# Patient Record
Sex: Female | Born: 1970 | Race: White | Hispanic: No | Marital: Married | Smoking: Never smoker
Health system: Southern US, Community
[De-identification: ages and names within clinical notes are randomized; demographics above are authoritative.]

## PROBLEM LIST (undated history)

## (undated) DIAGNOSIS — K219 Gastro-esophageal reflux disease without esophagitis: Secondary | ICD-10-CM

## (undated) DIAGNOSIS — T7840XA Allergy, unspecified, initial encounter: Secondary | ICD-10-CM

## (undated) HISTORY — PX: UPPER GASTROINTESTINAL ENDOSCOPY: SHX188

## (undated) HISTORY — DX: Allergy, unspecified, initial encounter: T78.40XA

## (undated) HISTORY — PX: WISDOM TOOTH EXTRACTION: SHX21

## (undated) HISTORY — DX: Gastro-esophageal reflux disease without esophagitis: K21.9

---

## 2005-11-16 HISTORY — PX: SIGMOIDOSCOPY: SUR1295

## 2016-01-09 ENCOUNTER — Encounter: Payer: Self-pay | Admitting: Family Medicine

## 2017-09-08 DIAGNOSIS — Z23 Encounter for immunization: Secondary | ICD-10-CM | POA: Diagnosis not present

## 2017-11-03 DIAGNOSIS — R87610 Atypical squamous cells of undetermined significance on cytologic smear of cervix (ASC-US): Secondary | ICD-10-CM | POA: Diagnosis not present

## 2017-11-03 DIAGNOSIS — Z01419 Encounter for gynecological examination (general) (routine) without abnormal findings: Secondary | ICD-10-CM | POA: Diagnosis not present

## 2017-11-03 DIAGNOSIS — Z124 Encounter for screening for malignant neoplasm of cervix: Secondary | ICD-10-CM | POA: Diagnosis not present

## 2017-11-03 DIAGNOSIS — Z6829 Body mass index (BMI) 29.0-29.9, adult: Secondary | ICD-10-CM | POA: Diagnosis not present

## 2017-11-03 DIAGNOSIS — N6310 Unspecified lump in the right breast, unspecified quadrant: Secondary | ICD-10-CM | POA: Diagnosis not present

## 2017-11-03 DIAGNOSIS — Z3040 Encounter for surveillance of contraceptives, unspecified: Secondary | ICD-10-CM | POA: Diagnosis not present

## 2017-12-07 DIAGNOSIS — N644 Mastodynia: Secondary | ICD-10-CM | POA: Diagnosis not present

## 2018-03-17 ENCOUNTER — Encounter: Payer: Self-pay | Admitting: Family Medicine

## 2018-03-17 ENCOUNTER — Other Ambulatory Visit: Payer: Self-pay

## 2018-03-17 ENCOUNTER — Ambulatory Visit: Payer: BLUE CROSS/BLUE SHIELD | Admitting: Family Medicine

## 2018-03-17 VITALS — BP 128/80 | HR 100 | Temp 98.6°F | Ht 62.5 in | Wt 169.2 lb

## 2018-03-17 DIAGNOSIS — Z7189 Other specified counseling: Secondary | ICD-10-CM | POA: Diagnosis not present

## 2018-03-17 DIAGNOSIS — Z119 Encounter for screening for infectious and parasitic diseases, unspecified: Secondary | ICD-10-CM

## 2018-03-17 DIAGNOSIS — Z111 Encounter for screening for respiratory tuberculosis: Secondary | ICD-10-CM | POA: Diagnosis not present

## 2018-03-17 DIAGNOSIS — G4719 Other hypersomnia: Secondary | ICD-10-CM

## 2018-03-17 DIAGNOSIS — Z7184 Encounter for health counseling related to travel: Secondary | ICD-10-CM

## 2018-03-17 DIAGNOSIS — R5383 Other fatigue: Secondary | ICD-10-CM

## 2018-03-17 MED ORDER — TYPHOID VACCINE PO CPDR: 1.0000 | DELAYED_RELEASE_CAPSULE | ORAL | 0 refills | Status: DC

## 2018-03-17 MED ORDER — DOXYCYCLINE HYCLATE 100 MG PO TABS: 100.0000 mg | ORAL_TABLET | Freq: Every day | ORAL | 1 refills | Status: DC

## 2018-03-17 NOTE — Patient Instructions (Addendum)
1. Lets do trial of changing for zytrec to allegra 2. Work on protein rich snacks during mid morning and afternoon    IF you received an x-ray today, you will receive an invoice from [facility]. Please contact [facility] at [phone] with questions or concerns regarding your invoice.   IF you received labwork today, you will receive an invoice from [facility]. Please contact [facility] at [phone] with questions or concerns regarding your invoice.   Our billing staff will not be able to assist you with questions regarding bills from these companies.  You will be contacted with the lab results as soon as they are available. The fastest way to get your results is to activate your My Chart account. Instructions are located on the last page of this paperwork. If you have not heard from [place] regarding the results in 2 weeks, please contact this office.

## 2018-03-17 NOTE — Progress Notes (Signed)
5/2/201911:55 AM  Cheryl Hutchinson 1971/02/22, 47 y.o. female [id number]  Chief Complaint  Patient presents with  . Establish Care    feeling very sleepy lately for the past 4 days, sleeps really well at night. Wondering is it related to the zyrtec shes taking for allergies    HPI:   Patient is a 47 y.o. female who presents today to establish care. She has several concerns.  Previous PCP Dr Edsel Petrin in [place] 2018, had mammo and pap done then Last gyn appt 08/2017 here in [place], rx OCPs Tdap 2012 Hep A and Hep B 2008 Typhoid 2008  1. 3-4 months of excessive daytime sleepiness. She reports she gets 7-8 hours of good sleep. Wakes up refreshed. Uses a bite guard for TMJ. Does not snore. She wonders if it is her zyrtec which she started taking several years ago for itchiness. She takes it at night. She has fallen asleep unintentionally twice at moment of sitting still with very little stimulus. She tends to feel very tired during mid morning and mid afternoon. She does not consume caffeine.  2. 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 She needs proof of immunity for measles and varicella. She also needs a quantiferon gold for TB testing.   3. She is also going to travel to [place] in June, going to [place], [place], [place]. Wondering about health related recommendations.  Fall Risk  03/17/2018  Falls in the past year? No     Depression screen PHQ 2/9 03/17/2018  Decreased Interest 0  Down, Depressed, Hopeless 0  PHQ - 2 Score 0    Allergies  Allergen Reactions  . Amoxicillin Rash    Prior to Admission medications   Medication Sig Start Date End Date Taking? Authorizing Provider  cetirizine (ZYRTEC) 10 MG chewable tablet Chew 10 mg by mouth daily.   Yes [provider]  levonorgestrel-ethinyl estradiol (SEASONALE,INTROVALE,JOLESSA) 0.15-0.03 MG tablet Take 1 tablet by mouth daily.   Yes [provider]     History reviewed. No pertinent past medical history.  Past Surgical History:  Procedure Laterality Date  . CESAREAN SECTION      Social History   Tobacco Use  . Smoking status: Never Smoker  . Smokeless tobacco: Never Used  Substance Use Topics  . Alcohol use: Not on file    Family History  Problem Relation Age of Onset  . COPD Mother   . Cancer Father   . Cancer Maternal Grandmother   . Heart disease Maternal Grandfather   . Cancer Paternal Grandfather     Review of Systems  Constitutional: Positive for malaise/fatigue. Negative for chills, fever and weight loss.  HENT: Negative for congestion, ear pain and sore throat.   Eyes: Negative for blurred vision and double vision.  Respiratory: Negative for cough and shortness of breath.   Cardiovascular: Negative for chest pain, palpitations and leg swelling.  Gastrointestinal: Positive for heartburn (globus sensation after she eats). Negative for abdominal pain, blood in stool, constipation, diarrhea, melena, nausea and vomiting.  Genitourinary: Negative for dysuria, frequency, hematuria and urgency.  Musculoskeletal: Negative for joint pain and myalgias.  Neurological: Negative for dizziness, sensory change, speech change, focal weakness and headaches.  Endo/Heme/Allergies: Negative for polydipsia.  Psychiatric/Behavioral: Negative for depression. The patient is not nervous/anxious and does not have insomnia.     OBJECTIVE:  Blood pressure 128/80, pulse 100, temperature 98.6 F (37 C), temperature source Oral, height  5' 2.5" (1.588 m), weight 169 lb 3.2 oz (76.7 kg), SpO2 98 %.  Physical Exam  Constitutional: She is oriented to person, place, and time. She appears well-developed and well-nourished.  HENT:  Head: Normocephalic and atraumatic.  Right Ear: Hearing, tympanic membrane, external ear and ear canal normal.  Left Ear: Hearing, tympanic membrane, external ear and ear canal normal.  Mouth/Throat: Oropharynx  is clear and moist.  Eyes: Pupils are equal, round, and reactive to light. Conjunctivae and EOM are normal.  Neck: Neck supple. No thyromegaly present.  Cardiovascular: Normal rate, regular rhythm, normal heart sounds and intact distal pulses. Exam reveals no gallop and no friction rub.  No murmur heard. Pulmonary/Chest: Effort normal and breath sounds normal. She has no wheezes. She has no rales.  Abdominal: Soft. Bowel sounds are normal. She exhibits no distension and no mass. There is no tenderness.  Musculoskeletal: Normal range of motion. She exhibits no edema.  Lymphadenopathy:    She has no cervical adenopathy.  Neurological: She is alert and oriented to person, place, and time. She has normal strength and normal reflexes. No cranial nerve deficit. Coordination and gait normal.  Skin: Skin is warm and dry.  Psychiatric: She has a normal mood and affect.  Nursing note and vitals reviewed.   ASSESSMENT and PLAN  1. Excessive daytime sleepiness Unclear etiology with reassuring exam. Discussed changing from zytrec to allegra and see if symptoms improve. Also discussed snacks, high protein, low tryptophan. Labs per below. Consider sleep eval. - CBC with Differential - Comprehensive metabolic panel - TSH - Sedimentation Rate - C-reactive protein - Vitamin D, 25-hydroxy  2. Screening examination for infectious disease - Measles/Mumps/Rubella Immunity - Varicella zoster antibody, IgG  3. Encounter for counseling for travel Discussed cdc travel guidelines with patient. Given she will be in [employer] [place], malaria prophylaxis and typhoid recommended.  - doxycycline (VIBRA-TABS) 100 MG tablet; Take 1 tablet (100 mg total) by mouth daily. Start 2 days before travel and continue for 4 weeks after arrival back into the [place] - typhoid (VIVOTIF) DR capsule; Take 1 capsule by mouth every other day. Start 2 weeks before travel  4. Screening-pulmonary TB - QuantiFERON-TB Gold  Plus   Return in about 3 months (around 06/17/2018).    Rutherford Guys, MD Primary Care at [place], [place] [address] Ph.  [phone] Fax [phone]

## 2018-03-18 LAB — CBC WITH DIFFERENTIAL/PLATELET
Basophils Absolute: 0 10*3/uL (ref 0.0–0.2)
Basos: 0 %
EOS (ABSOLUTE): 0.1 10*3/uL (ref 0.0–0.4)
Eos: 2 %
Hematocrit: 44.2 % (ref 34.0–46.6)
Hemoglobin: 14.6 g/dL (ref 11.1–15.9)
Immature Grans (Abs): 0 10*3/uL (ref 0.0–0.1)
Immature Granulocytes: 0 %
Lymphocytes Absolute: 2 10*3/uL (ref 0.7–3.1)
Lymphs: 39 %
MCH: 29 pg (ref 26.6–33.0)
MCHC: 33 g/dL (ref 31.5–35.7)
MCV: 88 fL (ref 79–97)
Monocytes Absolute: 0.4 10*3/uL (ref 0.1–0.9)
Monocytes: 8 %
Neutrophils Absolute: 2.7 10*3/uL (ref 1.4–7.0)
Neutrophils: 51 %
Platelets: 284 10*3/uL (ref 150–379)
RBC: 5.03 x10E6/uL (ref 3.77–5.28)
RDW: 13.4 % (ref 12.3–15.4)
WBC: 5.2 10*3/uL (ref 3.4–10.8)

## 2018-03-18 LAB — COMPREHENSIVE METABOLIC PANEL
ALT: 6 IU/L (ref 0–32)
AST: 9 IU/L (ref 0–40)
Albumin/Globulin Ratio: 1.4 (ref 1.2–2.2)
Albumin: 4 g/dL (ref 3.5–5.5)
Alkaline Phosphatase: 70 IU/L (ref 39–117)
BUN/Creatinine Ratio: 11 (ref 9–23)
BUN: 7 mg/dL (ref 6–24)
Bilirubin Total: 0.2 mg/dL (ref 0.0–1.2)
CO2: 21 mmol/L (ref 20–29)
Calcium: 9.2 mg/dL (ref 8.7–10.2)
Chloride: 107 mmol/L — ABNORMAL HIGH (ref 96–106)
Creatinine, Ser: 0.66 mg/dL (ref 0.57–1.00)
GFR calc Af Amer: 122 mL/min/{1.73_m2} (ref >59)
GFR calc non Af Amer: 106 mL/min/{1.73_m2} (ref >59)
Globulin, Total: 2.9 g/dL (ref 1.5–4.5)
Glucose: 96 mg/dL (ref 65–99)
Potassium: 4.1 mmol/L (ref 3.5–5.2)
Sodium: 140 mmol/L (ref 134–144)
Total Protein: 6.9 g/dL (ref 6.0–8.5)

## 2018-03-18 LAB — MEASLES/MUMPS/RUBELLA IMMUNITY
MUMPS ABS, IGG: 109 AU/mL (ref >10.9)
RUBEOLA AB, IGG: 82.4 AU/mL (ref >29.9)
Rubella Antibodies, IGG: 3.92 index (ref >0.99)

## 2018-03-18 LAB — VITAMIN D 25 HYDROXY (VIT D DEFICIENCY, FRACTURES): Vit D, 25-Hydroxy: 34.2 ng/mL (ref 30.0–100.0)

## 2018-03-18 LAB — C-REACTIVE PROTEIN: CRP: 16.5 mg/L — ABNORMAL HIGH (ref 0.0–4.9)

## 2018-03-18 LAB — TSH: TSH: 1.04 u[IU]/mL (ref 0.450–4.500)

## 2018-03-18 LAB — SEDIMENTATION RATE: Sed Rate: 24 mm/hr (ref 0–32)

## 2018-03-18 LAB — VARICELLA ZOSTER ANTIBODY, IGG: Varicella zoster IgG: 977 index (ref >165)

## 2018-03-23 LAB — QUANTIFERON-TB GOLD PLUS
QuantiFERON Mitogen Value: >10 IU/mL
QuantiFERON Nil Value: 0.09 IU/mL
QuantiFERON TB1 Ag Value: 0.07 IU/mL
QuantiFERON TB2 Ag Value: 0.06 IU/mL
QuantiFERON-TB Gold Plus: NEGATIVE

## 2018-03-28 ENCOUNTER — Telehealth: Payer: Self-pay | Admitting: Family Medicine

## 2018-03-28 DIAGNOSIS — R7982 Elevated C-reactive protein (CRP): Secondary | ICD-10-CM

## 2018-03-28 DIAGNOSIS — G4719 Other hypersomnia: Secondary | ICD-10-CM

## 2018-03-28 NOTE — Telephone Encounter (Signed)
Copied from [place] [phone] [phone]. Topic: Quick Communication - Lab Results >> Mar 28, 2018 11:42 AM Suszanne Finch, LPN wrote: Hulen Skains patient to inform them of  lab results. When patient returns call, triage nurse may disclose results. >> Mar 28, 2018  5:43 PM Marin Olp L wrote: No [facility] RN for lab results

## 2018-03-29 NOTE — Telephone Encounter (Signed)
  Pt given results per notes of Dr. Pamella Pert on 03/24/18. Unable to document in result note due to result note not being routed to [facility].  Pt is ok for referral to sleep medicine for further evaluation      Notes recorded by Rutherford Guys, MD on 03/24/2018 at 10:43 PM EDT Please let patient know that her labs are normal except for her CRP which is elevated. High end of normal is 5, Her CRP is 16.5. CRP is a non specific marker of inflammation and it has been associated with disordered sleep, specially with obstructive sleep apnea. I would like to make a referral to sleep medicine for further evaluation. Would she be ok with this? Thankst note not being routed to [facility].

## 2018-03-29 NOTE — Telephone Encounter (Signed)
Telephone call to pt. Left message to call back for lab results. provided phone number.

## 2018-03-30 NOTE — Addendum Note (Signed)
Addended by: Rutherford Guys on: 03/30/2018 11:30 AM   Modules accepted: Orders

## 2018-03-30 NOTE — Telephone Encounter (Signed)
done

## 2018-06-06 ENCOUNTER — Encounter: Payer: Self-pay | Admitting: Neurology

## 2018-06-06 ENCOUNTER — Ambulatory Visit (INDEPENDENT_AMBULATORY_CARE_PROVIDER_SITE_OTHER): Payer: BLUE CROSS/BLUE SHIELD | Admitting: Neurology

## 2018-06-06 VITALS — BP 121/80 | HR 96 | Ht 63.0 in | Wt 168.0 lb

## 2018-06-06 DIAGNOSIS — G4710 Hypersomnia, unspecified: Secondary | ICD-10-CM

## 2018-06-06 DIAGNOSIS — R51 Headache: Secondary | ICD-10-CM | POA: Diagnosis not present

## 2018-06-06 DIAGNOSIS — E663 Overweight: Secondary | ICD-10-CM

## 2018-06-06 DIAGNOSIS — R519 Headache, unspecified: Secondary | ICD-10-CM

## 2018-06-06 NOTE — Progress Notes (Signed)
Subjective:    Patient ID: Cheryl Hutchinson is a 47 y.o. female.  HPI     [name], MD, PhD [facility] [address] [place], [place] [address]  Dear Dr. Pamella Pert,   I saw your patient, Cheryl Hutchinson, upon your kind request in my neurologic clinic today for initial consultation of her sleep disturbance, in particular, recent onset of daytime somnolence. The patient is unaccompanied today. As you know, Cheryl Hutchinson is a 48 year old right-handed woman with an underlying medical history of allergic rhinitis and overweight state, who reports an approximately 1+ year history of excessive daytime somnolence despite keeping her nighttime sleep schedule stable and fairly adequate. Her Epworth sleepiness score is 10 out of 24, fatigue score is 21 out of 63. She gets sleepy while driving and knows to pull over. 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 Typically she is out of bed between 7 and 8:30. She does not have night to night nocturia but has experienced morning headaches occasionally. She does not give a history of migraines or restless leg symptoms, no PLMS as far she knows. She does have the occasional nighttime leg cramps. She is not sure if she snores, husband does not complain. She has had vivid dreams for a long time. She was not necessarily a sleepy child but a deep sleeper. When she had young children she was a light sleeper. She does not watch TV in her bedroom and their 2 cats are not in the bedroom area at night. She denies any cataplexy or sleep paralysis episodes. She does not give a family history of OSA or narcolepsy. She has a history of eczema and recent increase in pruritus which responded to Zyrtec. They moved from [place] about a year ago. She was fact the at the [employer] and now works as a Astronomer for [employer]. She has 2 teenage children. She gained about 10 pounds in  the past 2 years. I reviewed your office note from 03/17/18.   Her Past Medical History Is Significant For: No past medical history on file.  Her Past Surgical History Is Significant For: Past Surgical History:  Procedure Laterality Date  . CESAREAN SECTION      Her Family History Is Significant For: Family History  Problem Relation Age of Onset  . COPD Mother   . Cancer Father   . Cancer Maternal Grandmother   . Heart disease Maternal Grandfather   . Cancer Paternal Grandfather     Her Social History Is Significant For: Social History   Socioeconomic History  . Marital status: Married    Spouse name: Not on file  . Number of children: Not on file  . Years of education: Not on file  . Highest education level: Not on file  Occupational History  . Not on file  Social Needs  . Financial resource strain: Not on file  . Food insecurity:    Worry: Not on file    Inability: Not on file  . Transportation needs:    Medical: Not on file    Non-medical: Not on file  Tobacco Use  . Smoking status: Never Smoker  . Smokeless tobacco: Never Used  Substance and Sexual Activity  . Alcohol use: Not on file  . Drug use: Not on file  . Sexual activity: Yes  Lifestyle  . Physical activity:    Days  per week: Not on file    Minutes per session: Not on file  . Stress: Not on file  Relationships  . Social connections:    Talks on phone: Not on file    Gets together: Not on file    Attends religious service: Not on file    Active member of club or organization: Not on file    Attends meetings of clubs or organizations: Not on file    Relationship status: Not on file  Other Topics Concern  . Not on file  Social History Narrative  . Not on file    Her Allergies Are:  Allergies  Allergen Reactions  . Amoxicillin Rash  :   Her Current Medications Are:  Outpatient Encounter Medications as of 06/06/2018  Medication Sig  . cetirizine (ZYRTEC) 10 MG chewable tablet Chew 10 mg by  mouth daily.  [facility] Kitchen levonorgestrel-ethinyl estradiol (SEASONALE,INTROVALE,JOLESSA) 0.15-0.03 MG tablet Take 1 tablet by mouth daily.  . [DISCONTINUED] doxycycline (VIBRA-TABS) 100 MG tablet Take 1 tablet (100 mg total) by mouth daily. Start 2 days before travel and continue for 4 weeks after arrival back into the [place]  . [DISCONTINUED] typhoid (VIVOTIF) DR capsule Take 1 capsule by mouth every other day. Start 2 weeks before travel   No facility-administered encounter medications on file as of 06/06/2018.   :  Review of Systems:  Out of a complete 14 point review of systems, all are reviewed and negative with the exception of these symptoms as listed below: Review of Systems  Neurological:       Pt presents today to discuss her sleepiness. Pt has never had a sleep study and denies snoring.  Epworth Sleepiness Scale 0= would never doze 1= slight chance of dozing 2= moderate chance of dozing 3= high chance of dozing  Sitting and reading: 2 Watching TV: 2 Sitting inactive in a public place (ex. Theater or meeting): 1 As a passenger in a car for an hour without a break: 2 Lying down to rest in the afternoon: 2 Sitting and talking to someone: 0 Sitting quietly after lunch (no alcohol): 1 In a car, while stopped in traffic: 0 Total: 10     Objective:  Neurological Exam  Physical Exam Physical Examination:   Vitals:   06/06/18 0854  BP: 121/80  Pulse: 96    General Examination: The patient is a very pleasant 47 y.o. female in no acute distress. She appears well-developed and well-nourished and well groomed.   HEENT: Normocephalic, atraumatic, pupils are equal, round and reactive to light and accommodation. corrective eye glasses. Extraocular tracking is good without limitation to gaze excursion or nystagmus noted. Normal smooth pursuit is noted. Hearing is grossly intact. Face is symmetric with normal facial animation and normal facial sensation. Speech is clear with no dysarthria  noted. There is no hypophonia. There is no lip, neck/head, jaw or voice tremor. Neck is supple with full range of passive and active motion. There are no carotid bruits on auscultation. Oropharynx exam reveals: mild mouth dryness, adequate dental hygiene and mild airway crowding, due to smaller airway entry and wider uvula, tonsils are in place but small. Mallampati is class I. Tongue protrudes centrally and palate elevates symmetrically, tonsils are about 1+ bilaterally. Neck circumference is 14-1/2 inches.  Chest: Clear to auscultation without wheezing, rhonchi or crackles noted.  Heart: S1+S2+0, regular and normal without murmurs, rubs or gallops noted.   Abdomen: Soft, non-tender and non-distended with normal bowel sounds appreciated on auscultation.  Extremities: There is trace pitting edema in the distal lower extremities bilaterally. Pedal pulses are intact.  Skin: Warm and dry without trophic changes noted. There are no varicose veins.  Musculoskeletal: exam reveals no obvious joint deformities, tenderness or joint swelling or erythema.   Neurologically:  Mental status: The patient is awake, alert and oriented in all 4 spheres. Her immediate and remote memory, attention, language skills and fund of knowledge are appropriate. There is no evidence of aphasia, agnosia, apraxia or anomia. Speech is clear with normal prosody and enunciation. Thought process is linear. Mood is normal and affect is normal.  Cranial nerves II - XII are as described above under HEENT exam. In addition: shoulder shrug is normal with equal shoulder height noted. Motor exam: Normal bulk, strength and tone is noted. There is no drift, tremor or rebound. Romberg is negative. Reflexes are 2+ throughout. Fine motor skills and coordination: intact with normal finger taps, normal hand movements, normal rapid alternating patting, normal foot taps and normal foot agility.  Cerebellar testing: No dysmetria or intention tremor  on finger to nose testing. Heel to shin is unremarkable bilaterally. There is no truncal or gait ataxia.  Sensory exam: intact to light touch in the upper and lower extremities.  Gait, station and balance: She stands easily. No veering to one side is noted. No leaning to one side is noted. Posture is age-appropriate and stance is narrow based. Gait shows normal stride length and normal pace. No problems turning are noted. Tandem walk is unremarkable.  Assessment and Plan:  In summary, Cheryl MADYN IVINS is a very pleasant 47 y.o.-year old female with an underlying benign medical history of seasonal allergies, eczema, recurrent pruritus and overweight state, who presents for sleep evaluation for excessive daytime somnolence of the past year or longer. Her history is not telltale for narcolepsy but narcolepsy without cataplexy or idiopathic hypersomnolence are in the differential diagnosis. We will proceed with extended sleep study testing with a nighttime sleep study to rule out an organic sleep-related breathing disorder or PLMD and followed by a daytime nap study. She does not need to taper any medications for her sleep studies. Physical exam is unremarkable, neurological exam is nonfocal thankfully. She does not give a telltale history for obstructive sleep apnea but we did talk about underlying organic sleep disorders and more specifically OSA and PLMD. We talked about utilizing medications for symptomatic treatment down the Road to help her daytime somnolence, should she have test results in keeping with narcolepsy or idiopathic hypersomnolence. We will pick up our discussion after testing is done in the sleep lab. I answered all her questions today and she was in agreement with the plan. Thank you very much for allowing me to participate in the care of this nice patient. If I can be of any further assistance to you please do not hesitate to call me at [phone].  Sincerely,   [name], MD,  PhD

## 2018-06-06 NOTE — Patient Instructions (Signed)
I believe you may have an underlying sleepiness condition. This means, that you have a sleep disorder that manifests with excessive sleep and excessive sleepiness during the day. As discussed, we will proceed with extended sleep testing. I would like for you to come back for an overnight sleep study during which you will be monitored for sleep apnea, snoring, leg twitching, etc. We will then do nap study test the next day: 5 scheduled 20 min nap opportunities, every 2 hours. We will remind you to stay awake in between naps.   As explained, you will have to be off of any excessive caffeine or stimulant or antidepressant medication in preparation for the sleep studies. Keep your sleep schedule stable, you don't have to taper any medications or change your caffeine habits. We will call you to schedule your sleep studies.

## 2018-06-17 ENCOUNTER — Encounter: Payer: Self-pay | Admitting: Family Medicine

## 2018-06-17 ENCOUNTER — Ambulatory Visit: Payer: BLUE CROSS/BLUE SHIELD | Admitting: Family Medicine

## 2018-06-17 ENCOUNTER — Other Ambulatory Visit: Payer: Self-pay

## 2018-06-17 VITALS — BP 118/76 | HR 73 | Temp 97.8°F | Ht 63.0 in | Wt 168.8 lb

## 2018-06-17 DIAGNOSIS — G4719 Other hypersomnia: Secondary | ICD-10-CM | POA: Diagnosis not present

## 2018-06-17 NOTE — Progress Notes (Signed)
   8/2/20198:24 AM  Cheryl Hutchinson [birth year], 47 y.o. female [id number]  Chief Complaint  Patient presents with  . Follow-up    Excessive daytime sleeping, Having sleep study done in a few wks    HPI:   Patient is a 47 y.o. female with past medical history significant for excessive daytime sleepiness who presents today for followup  Has meet with sleep medicine, Dr Cherly Beach Will be having a sleep and nap study in the next several weeks Other than eval for OSA or PMLD, also wants to eval for nacroplepsy or hypersomnolence  Patient returned from trip to [place] well and without any events of concern. Completed malaria prophylaxis  She brings in immunization records from [place].  MMR and varicella titers in the system  Otherwise patient is doing well and has no acute concerns today  Fall Risk  06/17/2018 03/17/2018  Falls in the past year? No No     Depression screen [facility] 2/9 06/17/2018 03/17/2018  Decreased Interest 0 0  Down, Depressed, Hopeless 0 0  PHQ - 2 Score 0 0    Allergies  Allergen Reactions  . Amoxicillin Rash    Prior to Admission medications   Medication Sig Start Date End Date Taking? Authorizing Provider  cetirizine (ZYRTEC) 10 MG chewable tablet Chew 10 mg by mouth daily.   Yes [provider]  levonorgestrel-ethinyl estradiol (SEASONALE,INTROVALE,JOLESSA) 0.15-0.03 MG tablet Take 1 tablet by mouth daily.   Yes [provider]    History reviewed. No pertinent past medical history.  Past Surgical History:  Procedure Laterality Date  . CESAREAN SECTION      Social History   Tobacco Use  . Smoking status: Never Smoker  . Smokeless tobacco: Never Used  Substance Use Topics  . Alcohol use: Not on file    Family History  Problem Relation Age of Onset  . COPD Mother   . Cancer Father   . Cancer Maternal Grandmother   . Heart disease Maternal Grandfather   . Cancer Paternal Grandfather     ROS Per hpi  OBJECTIVE:  Blood  pressure 118/76, pulse 73, temperature 97.8 F (36.6 C), temperature source Oral, height '5\' 3"'$  (1.6 m), weight 168 lb 12.8 oz (76.6 kg), last menstrual period 05/17/2018, SpO2 98 %. Body mass index is 29.9 kg/m.   Physical Exam  Constitutional: She is oriented to person, place, and time. She appears well-developed and well-nourished.  HENT:  Head: Normocephalic and atraumatic.  Mouth/Throat: Mucous membranes are normal.  Eyes: Pupils are equal, round, and reactive to light. EOM are normal. No scleral icterus.  Neck: Neck supple.  Pulmonary/Chest: Effort normal.  Neurological: She is alert and oriented to person, place, and time.  Skin: Skin is warm and dry.  Psychiatric: She has a normal mood and affect.  Nursing note and vitals reviewed.   ASSESSMENT and PLAN  1. Excessive daytime sleepiness Has established with neuro. Pending sleep studies to elucidate cause and treat accordingly. Driving precautions reviewed.  Immunizations records updated.  Return in about 1 year (around 06/18/2019) for CPE.    Rutherford Guys, MD Primary Care at [place], [place] [address] Ph.  [phone] Fax [phone]

## 2018-06-17 NOTE — Patient Instructions (Signed)
     IF you received an x-ray today, you will receive an invoice from [facility]. Please contact [facility] at [phone] with questions or concerns regarding your invoice.   IF you received labwork today, you will receive an invoice from [facility]. Please contact [facility] at [phone] with questions or concerns regarding your invoice.   Our billing staff will not be able to assist you with questions regarding bills from these companies.  You will be contacted with the lab results as soon as they are available. The fastest way to get your results is to activate your My Chart account. Instructions are located on the last page of this paperwork. If you have not heard from us regarding the results in 2 weeks, please contact this office.     

## 2018-06-26 ENCOUNTER — Encounter: Payer: Self-pay | Admitting: Family Medicine

## 2018-07-13 ENCOUNTER — Ambulatory Visit (INDEPENDENT_AMBULATORY_CARE_PROVIDER_SITE_OTHER): Payer: BLUE CROSS/BLUE SHIELD | Admitting: Neurology

## 2018-07-13 DIAGNOSIS — G4710 Hypersomnia, unspecified: Secondary | ICD-10-CM | POA: Diagnosis not present

## 2018-07-13 DIAGNOSIS — G4720 Circadian rhythm sleep disorder, unspecified type: Secondary | ICD-10-CM

## 2018-07-13 DIAGNOSIS — G4719 Other hypersomnia: Secondary | ICD-10-CM

## 2018-07-14 ENCOUNTER — Ambulatory Visit (INDEPENDENT_AMBULATORY_CARE_PROVIDER_SITE_OTHER): Payer: BLUE CROSS/BLUE SHIELD | Admitting: Neurology

## 2018-07-14 DIAGNOSIS — G4752 REM sleep behavior disorder: Secondary | ICD-10-CM | POA: Diagnosis not present

## 2018-07-14 DIAGNOSIS — G4710 Hypersomnia, unspecified: Secondary | ICD-10-CM | POA: Diagnosis not present

## 2018-07-14 DIAGNOSIS — R51 Headache: Secondary | ICD-10-CM | POA: Diagnosis not present

## 2018-07-14 DIAGNOSIS — E663 Overweight: Secondary | ICD-10-CM | POA: Diagnosis not present

## 2018-07-14 NOTE — Addendum Note (Signed)
Addended by: Inis Sizer D on: 07/14/2018 10:01 AM   Modules accepted: Orders

## 2018-07-14 NOTE — Addendum Note (Signed)
Addended by: Lester Elkins A on: 07/14/2018 10:01 AM   Modules accepted: Orders

## 2018-07-19 LAB — COMPREHENSIVE DRUG ANALYSIS,UR

## 2018-07-21 ENCOUNTER — Telehealth: Payer: Self-pay

## 2018-07-21 NOTE — Telephone Encounter (Signed)
Pt returned RN's call. She accept the appt for 9/10 @ 1:30, ckin at 1:00.

## 2018-07-21 NOTE — Telephone Encounter (Signed)
-----   Message from [name], MD sent at 07/21/2018  7:58 AM EDT ----- Patient referred by Dr. Pamella Pert, seen by me on 06/06/18, diagnostic PSG on 8/28 and MSLT on 07/14/18.   Please call and notify the patient that the recent sleep study did not show any significant obstructive sleep apnea, except mild/soft intermittent snoring. Next day nap study showed mild and non-specific sleepiness for 5 naps, with a mean sleep latency of 11.4 min, not supportive of an intrinsic sleepiness d/o such as narcolepsy. Please inform patient that I would like to go over the details of the study during a follow up appointment. Arrange a followup appointment. Also, route or fax report to PCP and referring MD, if other than PCP.  Once you have spoken to patient, you can close this encounter.   Thanks,  [name], MD, PhD [facility])

## 2018-07-21 NOTE — Procedures (Signed)
Name:  Cheryl Hutchinson, Cheryl Hutchinson Reference [id number]  Study Date: 07/14/2018 Procedure #: 2412  DOB: March 02, 1971    HISTORY: 47 year old woman with a history of allergic rhinitis and overweight state, who reports an approximately 1+ year history of excessive daytime somnolence despite keeping her nighttime sleep schedule stable and fairly adequate. The patient endorsed the Epworth Sleepiness Scale at 10 points. The patient's weight 168 pounds with a height of 63 (inches), resulting in a BMI of 29.7 kg/m2. The patient's neck circumference measured 14.5 inches. She presents for a PSG with next day MSLT. A UDS on 07/14/18 was negative for any drugs/medications.   Protocol  This is a 13 channel Multiple Sleep Latency Test comprised of 5 channels of EEG (T3-Cz, Cz-T4, F4-M1, C4-M1, O2-M1), 3 channels of Chin EMG, 4 channels of EOG and 1 channel for ECG.   All channels were sampled at 256hz .    This polysomnographic procedure is designed to evaluate (1) the complaint of excessive daytime sleepiness by quantifying the time required to fall asleep and (2) the possibility of narcolepsy by checking for abnormally short latencies to REM sleep.  Electrographic variables include EEG, EMG, EOG and ECG.  Patients are monitored throughout four or five 20-minute opportunities to sleep (naps) at two-hour intervals.  For each nap, the patient is allowed 20 minutes to fall asleep.  Once asleep, the patient is awakened after 15 minutes.  Between naps, the patient is kept as alert as possible.  A sleep latency of 20 minutes indicates that no sleep occurred.  Parametric Analysis  Total Number of Naps 5     NAP # Time of Nap  Sleep Latency (mins) REM Latency (mins) Sleep Time Percent Awake Time Percent  1 07:34 7.5 0 60 40   2 09:31 9 0 61  39   3 11:26 11.5 0 43  57   4 13:24 20 0 0  100   5 15:18 9 0 62  38    MSLT Summary of Naps  Mean Sleep Latency to all Five Naps: 11.4   Results from Preceding PSG Study  Sleep  Onset Time 22:34 Sleep Efficiency (%) 86.2  Rise Time 6:05 Sleep Latency (min) 51  Total Sleep Time  7.2hr REM Latency (min) 67       IMPRESSION:  1. This multiple sleep latency test reveals a mean sleep latency of 11.4 minutes with 0 sleep periods during which REM sleep was recorded.   2. A total of 5 sleep periods were recorded.   3. This study was preceded by an overnight polysomnogram with a total sleep time (TST) of 431.5 minutes.       RECOMMENDATIONS:  1. The nighttime sleep study study does not demonstrate any significant obstructive or central sleep disordered breathing, with the exception of mild intermittent snoring. 2. The nocturnal PSG and next day MSLT indicate rather mild and non-specific daytime somnolence, but the studies do not support the diagnosis of idiopathic hypersomnolence or narcolepsy.  3. The nighttime sleep study shows some sleep fragmentation and mildly abnormal sleep stage percentages; these are nonspecific findings and per se do not signify an intrinsic sleep disorder or a cause for the patient's sleep-related symptoms. Causes include (but are not limited to) the first night effect of the sleep study, circadian rhythm disturbances, medication effect or an underlying mood disorder or medical problem.  4. The patient should be cautioned not to drive, work at heights, or operate dangerous or heavy equipment when tired or sleepy. Review  and reiteration of good sleep hygiene measures should be pursued with any patient. 5. The patient will be seen in follow-up by Dr. Rexene Alberts at [facility] for discussion of the test results and further management strategies. The referring provider will be notified of the test results.  I certify that I have reviewed the entire raw data recording prior to the issuance of this report in accordance with the Standards of Accreditation of the American Academy of Sleep Medicine (AASM)   [name], MD, PhD Diplomat, American Board of Neurology and  Sleep Medicine (Neurology and Sleep Medicine)

## 2018-07-21 NOTE — Progress Notes (Signed)
See result note for PSG. sa

## 2018-07-21 NOTE — Procedures (Signed)
PATIENT'S NAME:  Cheryl Hutchinson, Cheryl Hutchinson DOB:      01-Mar-1971      MR#:    [id number]     DATE OF RECORDING: 07/13/2018 REFERRING M.D.:  Grant Fontana MD Study Performed:   Baseline Polysomnogram HISTORY: 47 year old woman with a history of allergic rhinitis and overweight state, who reports an approximately 1+ year history of excessive daytime somnolence despite keeping her nighttime sleep schedule stable and fairly adequate. The patient endorsed the Epworth Sleepiness Scale at 10 points. The patient's weight 168 pounds with a height of 63 (inches), resulting in a BMI of 29.7 kg/m2. The patient's neck circumference measured 14.5 inches. She presents for a PSG with next day MSLT. A UDS on 07/14/18 was negative for any drugs/medications.   CURRENT MEDICATIONS: Zyrtec, Seasonale   PROCEDURE:  This is a multichannel digital polysomnogram utilizing the Somnostar 11.2 system.  Electrodes and sensors were applied and monitored per AASM Specifications.   EEG, EOG, Chin and Limb EMG, were sampled at 200 Hz.  ECG, Snore and Nasal Pressure, Thermal Airflow, Respiratory Effort, CPAP Flow and Pressure, Oximetry was sampled at 50 Hz. Digital video and audio were recorded.      BASELINE STUDY  Lights Out was at 21:45 and Lights On at 06:05.  Total recording time (TRT) was 500.5 minutes, with a total sleep time (TST) of 431.5 minutes.   The patient's sleep latency was 51 minutes, which is delayed.  REM latency was 67 minutes, which is slightly reduced. The sleep efficiency was 86.2 %.     SLEEP ARCHITECTURE: WASO (Wake after sleep onset) was 17 minutes with minimal to mild sleep fragmentation noted. There were 24 minutes in Stage N1, 127 minutes Stage N2, 144 minutes Stage N3 and 136.5 minutes in Stage REM.  The percentage of Stage N1 was 5.6%, Stage N2 was 29.4%, which is reduced, Stage N3 was 33.4%, which is increased, and Stage R (REM sleep) was 31.6%, which is increased. The arousals were noted as: 38 were spontaneous, 0 were  associated with PLMs, 1 were associated with respiratory events.  RESPIRATORY ANALYSIS:  There were a total of 1 respiratory events:  0 obstructive apneas, 0 central apneas and 0 mixed apneas with a total of 0 apneas and an apnea index (AI) of 0 /hour. There were 1 hypopneas with a hypopnea index of .1 /hour. The patient also had 0 respiratory event related arousals (RERAs).      The total APNEA/HYPOPNEA INDEX (AHI) was .1 /hour and the total RESPIRATORY DISTURBANCE INDEX was 0. .1 /hour.  0 events occurred in REM sleep and 2 events in NREM. The REM AHI was 0. 0 /hour, versus a non-REM AHI of .2. The patient spent 192 minutes of total sleep time in the supine position and 240 minutes in non-supine.. The supine AHI was 0.3 versus a non-supine AHI of 0.0.  OXYGEN SATURATION & C02:  The Wake baseline 02 saturation was 96%, with the lowest being 89%. Time spent below 89% saturation equaled 0 minutes. PERIODIC LIMB MOVEMENTS: The patient had a total of 0 Periodic Limb Movements.  The Periodic Limb Movement (PLM) index was 0 and the PLM Arousal index was 0/hour.  Audio and video analysis did not show any abnormal or unusual movements, behaviors, phonations or vocalizations. The patient took no bathroom breaks. Soft intermittent snoring was noted. The EKG was in keeping with normal sinus rhythm (NSR).  Post-study, the patient indicated that sleep was worse than usual.   The patient had  a nap study, MSLT, next day on 07/14/18 with a mean sleep latency of 11.4 minutes for 5 naps and 0 SOREMPs (sleep onset REM periods) noted. This indicates no pathological or significant daytime sleepiness, and may reflect a non-specific tiredness/sleepiness, not in the realm of narcolepsy or idiopathic hypersomnolence.   IMPRESSION:  1. Dysfunctions associated with sleep stages or arousal from sleep 2. Excessive daytime sleepiness  RECOMMENDATIONS:  1. The nighttime sleep study study does not demonstrate any significant  obstructive or central sleep disordered breathing, with the exception of mild intermittent snoring. 2. The nocturnal PSG and next day MSLT indicate rather mild and non-specific daytime somnolence, but the studies do not support the diagnosis of idiopathic hypersomnolence or narcolepsy.  3. The nighttime sleep study shows some sleep fragmentation and mildly abnormal sleep stage percentages; these are nonspecific findings and per se do not signify an intrinsic sleep disorder or a cause for the patient's sleep-related symptoms. Causes include (but are not limited to) the first night effect of the sleep study, circadian rhythm disturbances, medication effect or an underlying mood disorder or medical problem.  4. The patient should be cautioned not to drive, work at heights, or operate dangerous or heavy equipment when tired or sleepy. Review and reiteration of good sleep hygiene measures should be pursued with any patient. 5. The patient will be seen in follow-up by Dr. Rexene Alberts at [facility] for discussion of the test results and further management strategies. The referring provider will be notified of the test results.  I certify that I have reviewed the entire raw data recording prior to the issuance of this report in accordance with the Standards of Accreditation of the American Academy of Sleep Medicine (AASM)   [name], MD, PhD Diplomat, American Board of Neurology and Sleep Medicine (Neurology and Sleep Medicine)

## 2018-07-21 NOTE — Telephone Encounter (Signed)
I called pt to offer her an appt on 07/26/18 at 1:30pm. No answer, left a message asking her to call me back. If pt calls back, please offer her this appt date and time.

## 2018-07-21 NOTE — Telephone Encounter (Signed)
I called pt to explain her sleep study results. Pt is agreeable to coming in for an appt to discuss the results in more detail. There are no available appts in the next few weeks but I will call her when an appt opens up. Pt verbalized understanding of results. Pt had no questions at this time but was encouraged to call back if questions arise.

## 2018-07-21 NOTE — Progress Notes (Signed)
Patient referred by Dr. Pamella Pert, seen by me on 06/06/18, diagnostic PSG on 8/28 and MSLT on 07/14/18.   Please call and notify the patient that the recent sleep study did not show any significant obstructive sleep apnea, except mild/soft intermittent snoring. Next day nap study showed mild and non-specific sleepiness for 5 naps, with a mean sleep latency of 11.4 min, not supportive of an intrinsic sleepiness d/o such as narcolepsy. Please inform patient that I would like to go over the details of the study during a follow up appointment. Arrange a followup appointment. Also, route or fax report to PCP and referring MD, if other than PCP.  Once you have spoken to patient, you can close this encounter.   Thanks,  [name], MD, PhD [facility])

## 2018-07-26 ENCOUNTER — Ambulatory Visit: Payer: BLUE CROSS/BLUE SHIELD | Admitting: Neurology

## 2018-07-26 ENCOUNTER — Encounter: Payer: Self-pay | Admitting: Neurology

## 2018-07-26 VITALS — BP 116/71 | HR 80 | Ht 63.0 in | Wt 170.0 lb

## 2018-07-26 DIAGNOSIS — R799 Abnormal finding of blood chemistry, unspecified: Secondary | ICD-10-CM | POA: Diagnosis not present

## 2018-07-26 DIAGNOSIS — G4710 Hypersomnia, unspecified: Secondary | ICD-10-CM

## 2018-07-26 NOTE — Patient Instructions (Signed)
You can consider taking over the counter vitamin D, such as 2000 units daily. Your last vitamin D level was on the lower end at 34 (normal range 30-100).   We will check your B12 level today and call you.   Please remember to try to maintain good sleep hygiene, which means: Keep a regular sleep and wake schedule, try not to exercise or have a meal within 2 hours of your bedtime, try to keep your bedroom conducive for sleep, that is, cool and dark, without light distractors such as an illuminated alarm clock, and refrain from watching TV right before sleep or in the middle of the night and do not keep the TV or radio on during the night. Also, try not to use or play on electronic devices at bedtime, such as your cell phone, tablet PC or laptop. If you like to read at bedtime on an electronic device, try to dim the background light as much as possible. Do not eat in the middle of the night.   Please make more time for sleep, 7.5 to 8.5 hours.

## 2018-07-26 NOTE — Progress Notes (Signed)
Subjective:    Patient ID: Cheryl Hutchinson is a 47 y.o. female.  HPI     Interim history:   Ms. Cheryl Hutchinson is a 47 year old right-handed woman with an underlying medical history of allergic rhinitis and overweight state, who presents for follow-up consultation of her excessive daytime somnolence. The patient is unaccompanied today. I first met her on 06/06/2018 at the request of her primary care physician, at which time she reported a 1+ year history of daytime somnolence despite trying to get enough sleep at night of around 7 hours on average. She did not have a telltale history for restless leg syndrome or sleep apnea and I suggested we proceed with extended sleep study testing in the form of nocturnal polysomnogram, followed by a daytime nap study. She had interim testing. I went over her test results with her in detail today. Baseline sleep study from 07/13/2018 showed a delayed sleep latency of 51 minutes and REM latency was slightly reduced at 67 minutes, sleep efficiency was 86.2%. She had an increased percentage of slow-wave sleep and REM sleep was mildly increased at 31.6%. She had no significant sleep disordered breathing and her AHI was 0.1 per hour, average oxygen saturation was 96%, nadir was 89%. She had no significant PLMS and only soft and intermittent snoring. She had a next a nap study on 07/14/2018 with a mean sleep latency of 11.4 minutes for 5 total with no sleep onset REM periods noted. She had no significant for pathological sleepiness the next day, slept in 4 out of 5 naps. UDS was neg.  Today, 07/26/2018: She reports symptoms, denies recurrent headaches. She has had no recent change in her medical history or medications.    The patient's allergies, current medications, family history, past medical history, past social history, past surgical history and problem list were reviewed and updated as appropriate.   Previously:   06/06/2018: (She) reports an approximately 1+ year  history of excessive daytime somnolence despite keeping her nighttime sleep schedule stable and fairly adequate. Her Epworth sleepiness score is 10 out of 24, fatigue score is 21 out of 63. She gets sleepy while driving and knows to pull over. 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 Typically she is out of bed between 7 and 8:30. She does not have night to night nocturia but has experienced morning headaches occasionally. She does not give a history of migraines or restless leg symptoms, no PLMS as far she knows. She does have the occasional nighttime leg cramps. She is not sure if she snores, husband does not complain. She has had vivid dreams for a long time. She was not necessarily a sleepy child but a deep sleeper. When she had young children she was a light sleeper. She does not watch TV in her bedroom and their 2 cats are not in the bedroom area at night. She denies any cataplexy or sleep paralysis episodes. She does not give a family history of OSA or narcolepsy. She has a history of eczema and recent increase in pruritus which responded to Zyrtec. They moved from [place] about a year ago. 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 She has 2 teenage children. She gained about 10 pounds in the past 2 years. I reviewed your office note from 03/17/18.   Her Past Medical History Is Significant For: No past medical  history on file.  Her Past Surgical History Is Significant For: Past Surgical History:  Procedure Laterality Date  . CESAREAN SECTION      Her Family History Is Significant For: Family History  Problem Relation Age of Onset  . COPD Mother   . Cancer Father   . Cancer Maternal Grandmother   . Heart disease Maternal Grandfather   . Cancer Paternal Grandfather     Her Social History Is Significant For: Social History   Socioeconomic History  . Marital status: Married    Spouse  name: Not on file  . Number of children: Not on file  . Years of education: Not on file  . Highest education level: Not on file  Occupational History  . Not on file  Social Needs  . Financial resource strain: Not on file  . Food insecurity:    Worry: Not on file    Inability: Not on file  . Transportation needs:    Medical: Not on file    Non-medical: Not on file  Tobacco Use  . Smoking status: Never Smoker  . Smokeless tobacco: Never Used  Substance and Sexual Activity  . Alcohol use: Not on file  . Drug use: Not on file  . Sexual activity: Yes  Lifestyle  . Physical activity:    Days per week: Not on file    Minutes per session: Not on file  . Stress: Not on file  Relationships  . Social connections:    Talks on phone: Not on file    Gets together: Not on file    Attends religious service: Not on file    Active member of club or organization: Not on file    Attends meetings of clubs or organizations: Not on file    Relationship status: Not on file  Other Topics Concern  . Not on file  Social History Narrative  . Not on file    Her Allergies Are:  Allergies  Allergen Reactions  . Amoxicillin Rash  :   Her Current Medications Are:  Outpatient Encounter Medications as of 07/26/2018  Medication Sig  . cetirizine (ZYRTEC) 10 MG chewable tablet Chew 10 mg by mouth daily.  Marland Kitchen levonorgestrel-ethinyl estradiol (SEASONALE,INTROVALE,JOLESSA) 0.15-0.03 MG tablet Take 1 tablet by mouth daily.   No facility-administered encounter medications on file as of 07/26/2018.   :  Review of Systems:  Out of a complete 14 point review of systems, all are reviewed and negative with the exception of these symptoms as listed below: Review of Systems  Neurological:       Pt presents today to discuss her sleep study results.    Objective:  Neurological Exam  Physical Exam Physical Examination:   Vitals:   07/26/18 1330  BP: 116/71  Pulse: 80    General Examination: The  patient is a very pleasant 47 y.o. female in no acute distress. She appears well-developed and well-nourished and well groomed.   HEENT: Normocephalic, atraumatic, pupils are equal, round and reactive to light and accommodation. corrective eye glasses. Extraocular tracking is good without limitation to gaze excursion or nystagmus noted. Normal smooth pursuit is noted. Hearing is grossly intact. Face is symmetric with normal facial animation and normal facial sensation. Speech is clear with no dysarthria noted. There is no hypophonia. There is no lip, neck/head, jaw or voice tremor. Neck shows FROM. Oropharynx exam reveals: no obvious change.   Chest: Clear to auscultation without wheezing, rhonchi or crackles noted.  Heart: S1+S2+0, regular and  normal without murmurs, rubs or gallops noted.   Abdomen: Soft, non-tender and non-distended.  Extremities: There is trace pitting edema in the distal lower extremities bilaterally.  Skin: Warm and dry without trophic changes noted. There are no varicose veins.  Musculoskeletal: exam reveals no obvious joint deformities, tenderness or joint swelling or erythema.   Neurologically:  Mental status: The patient is awake, alert and oriented in all 4 spheres. Her immediate and remote memory, attention, language skills and fund of knowledge are appropriate. There is no evidence of aphasia, agnosia, apraxia or anomia. Speech is clear with normal prosody and enunciation. Thought process is linear. Mood is normal and affect is normal.  Cranial nerves II - XII are as described above under HEENT exam. Motor exam: Normal bulk, strength and tone is noted. There is no tremor. Romberg is negative. Fine motor skills and coordination:grossly intact.  Cerebellar testing: No dysmetria or intention tremor. There is no truncal or gait ataxia.  Sensory exam: intact to light touch in the upper and lower extremities.  Gait, station and balance: She stands easily. No  veering to one side is noted. No leaning to one side is noted. Posture is age-appropriate and stance is narrow based. Gait shows normal stride length and normal pace. No problems turning are noted. Tandem walk is unremarkable.  Assessment and Plan:  In summary, Cheryl TOMOKO SANDRA is a very pleasant 47 year old female with an underlying benign medical history of seasonal allergies, eczema, recurrent pruritus and overweight state, who presents for follow-up consultation of her daytime somnolence of at least one years duration. She had recent sleep study testing which did not show any significant underlying organic sleep disorders such as PLMD or sleep disordered breathing which was reassuring, nap study testing showed no telltale findings confirming narcolepsy or in keeping with idiopathic hypersomnolence. She did have some mild and nonspecific sleepiness with a mean sleep latency of 11.4 minutes 45 naps with no sleep onset REM periods. I explained her test results to her in detail. She is at this juncture advised to try to optimize his sleep schedule to allow for 7-1/2-8-1/2 hours of sleep on average. Furthermore, the reviewed recent blood work. In May, her vitamin D level was on the lower end of the spectrum at 16 and she is advised to consider adding an over-the-counter vitamin D supplement. We will check B12 level today and call her with her results. At this juncture, I can see her back on an as-needed basis. I answered all her questions today and she was in agreement. I spent 25 minutes in total face-to-face time with the patient, more than 50% of which was spent in counseling and coordination of care, reviewing test results, reviewing medication and discussing or reviewing the diagnosis of Excessive daytime somnolence, its prognosis and treatment options. Pertinent laboratory and imaging test results that were available during this visit with the patient were reviewed by me and considered in my medical  decision making (see chart for details).

## 2018-07-27 LAB — B12 AND FOLATE PANEL
FOLATE: 12.7 ng/mL (ref >3.0)
Vitamin B-12: 189 pg/mL — ABNORMAL LOW (ref 232–1245)

## 2018-07-28 ENCOUNTER — Telehealth: Payer: Self-pay

## 2018-07-28 NOTE — Progress Notes (Signed)
B12 low, I recommend she talk with PCP about supplementation options, starting with B12 injections on a regular basis. In light of her tiredness/sleepiness, this may be a possible treatable cause.  pls uptdate pt.  Michel Bickers

## 2018-07-28 NOTE — Telephone Encounter (Signed)
-----   Message from [name], MD sent at 07/28/2018  7:50 AM EDT ----- B12 low, I recommend she talk with PCP about supplementation options, starting with B12 injections on a regular basis. In light of her tiredness/sleepiness, this may be a possible treatable cause.  pls uptdate pt.  Cheryl Hutchinson

## 2018-07-28 NOTE — Telephone Encounter (Signed)
I called Cheryl Hutchinson and discussed her lab work results. Cheryl Hutchinson is agreeable to discussing B12 supplementation with Dr. Pamella Pert. Cheryl Hutchinson verbalized understanding of results. Cheryl Hutchinson had no questions at this time but was encouraged to call back if questions arise.

## 2018-07-31 ENCOUNTER — Encounter: Payer: Self-pay | Admitting: Family Medicine

## 2018-07-31 DIAGNOSIS — E559 Vitamin D deficiency, unspecified: Secondary | ICD-10-CM

## 2018-07-31 DIAGNOSIS — E538 Deficiency of other specified B group vitamins: Secondary | ICD-10-CM

## 2018-12-01 DIAGNOSIS — Z1239 Encounter for other screening for malignant neoplasm of breast: Secondary | ICD-10-CM | POA: Diagnosis not present

## 2018-12-01 DIAGNOSIS — Z3040 Encounter for surveillance of contraceptives, unspecified: Secondary | ICD-10-CM | POA: Diagnosis not present

## 2018-12-01 DIAGNOSIS — Z6830 Body mass index (BMI) 30.0-30.9, adult: Secondary | ICD-10-CM | POA: Diagnosis not present

## 2018-12-01 DIAGNOSIS — Z01419 Encounter for gynecological examination (general) (routine) without abnormal findings: Secondary | ICD-10-CM | POA: Diagnosis not present

## 2018-12-08 DIAGNOSIS — Z1231 Encounter for screening mammogram for malignant neoplasm of breast: Secondary | ICD-10-CM | POA: Diagnosis not present

## 2019-03-20 ENCOUNTER — Encounter: Payer: Self-pay | Admitting: Family Medicine

## 2019-03-24 ENCOUNTER — Encounter: Payer: Self-pay | Admitting: Family Medicine

## 2019-03-24 ENCOUNTER — Ambulatory Visit (INDEPENDENT_AMBULATORY_CARE_PROVIDER_SITE_OTHER): Payer: BLUE CROSS/BLUE SHIELD | Admitting: Family Medicine

## 2019-03-24 ENCOUNTER — Other Ambulatory Visit: Payer: Self-pay

## 2019-03-24 VITALS — BP 117/80 | HR 87 | Temp 98.3°F | Ht 63.0 in | Wt 169.0 lb

## 2019-03-24 DIAGNOSIS — Z13228 Encounter for screening for other metabolic disorders: Secondary | ICD-10-CM | POA: Diagnosis not present

## 2019-03-24 DIAGNOSIS — Z1329 Encounter for screening for other suspected endocrine disorder: Secondary | ICD-10-CM

## 2019-03-24 DIAGNOSIS — Z0001 Encounter for general adult medical examination with abnormal findings: Secondary | ICD-10-CM

## 2019-03-24 DIAGNOSIS — Z13220 Encounter for screening for lipoid disorders: Secondary | ICD-10-CM | POA: Diagnosis not present

## 2019-03-24 DIAGNOSIS — E559 Vitamin D deficiency, unspecified: Secondary | ICD-10-CM

## 2019-03-24 DIAGNOSIS — Z1321 Encounter for screening for nutritional disorder: Secondary | ICD-10-CM

## 2019-03-24 DIAGNOSIS — M7662 Achilles tendinitis, left leg: Secondary | ICD-10-CM | POA: Diagnosis not present

## 2019-03-24 DIAGNOSIS — E538 Deficiency of other specified B group vitamins: Secondary | ICD-10-CM

## 2019-03-24 DIAGNOSIS — Z0000 Encounter for general adult medical examination without abnormal findings: Secondary | ICD-10-CM

## 2019-03-24 DIAGNOSIS — J45990 Exercise induced bronchospasm: Secondary | ICD-10-CM

## 2019-03-24 DIAGNOSIS — Z130 Encounter for screening for diseases of the blood and blood-forming organs and certain disorders involving the immune mechanism: Secondary | ICD-10-CM

## 2019-03-24 MED ORDER — ALBUTEROL SULFATE HFA 108 (90 BASE) MCG/ACT IN AERS: 2.0000 | INHALATION_SPRAY | Freq: Four times a day (QID) | RESPIRATORY_TRACT | 0 refills | Status: DC | PRN

## 2019-03-24 NOTE — Progress Notes (Signed)
5/8/20208:47 AM  Cheryl Hutchinson January 20, 1971, 48 y.o., female [id number]  Chief Complaint  Patient presents with  . Annual Exam    pain in the left calf area since January thinks it may be due to exercising.     HPI:   Patient is a 49 y.o. female who presents today for CPE  Pap: 13-Jan-2017, Dr Benay Pike, [facility] : on [facility], managed by gyn Menses: per OCPs Mammogram:  2019-01-13, normal per patient, managed by gyn FHX breast/ovarian cancer: MGM breast cancer in her 63s, no ovarian cancer FHx colon cancer: PGM died of "stomach cancer" in January 13, 1967, unsure if colon cancer or not Exercise/diet: has been exercising regularly since Jan, rowing, since then has been having tightness Left lower calf, mild occ swelling, no erythema or warmth. Makes walking painful, no pain at rest. She also notices persistent cough and occ chest tightness after a hard workout. No SOB or wheezing. No h/o asthma. Nonsmoker.  Most Recent Immunizations  Administered Date(s) Administered  . Hepatitis A 05/31/2007  . Hepatitis B 11/04/2007  . Tdap 04/28/2011  . Typhoid Inactivated 03/17/2018    Fall Risk  03/24/2019 06/17/2018 03/17/2018  Falls in the past year? 0 No No  Number falls in past yr: 0 - -  Injury with Fall? 0 - -     Depression screen [facility] 2/9 03/24/2019 06/17/2018 03/17/2018  Decreased Interest 0 0 0  Down, Depressed, Hopeless 0 0 0  PHQ - 2 Score 0 0 0    Allergies  Allergen Reactions  . Amoxicillin Rash    Prior to Admission medications   Medication Sig Start Date End Date Taking? Authorizing Provider  cetirizine (ZYRTEC) 10 MG chewable tablet Chew 10 mg by mouth daily.    [provider]  Lenda Kelp 1/20 1-20 MG-MCG tablet  12/02/18   [provider]    History reviewed. No pertinent past medical history.  Past Surgical History:  Procedure Laterality Date  . CESAREAN SECTION      Social History   Tobacco Use  . Smoking status: Never Smoker  . Smokeless tobacco: Never Used   Substance Use Topics  . Alcohol use: Not on file    Family History  Problem Relation Age of Onset  . COPD Mother   . Cancer Father   . Cancer Maternal Grandmother   . Heart disease Maternal Grandfather   . Cancer Paternal Grandfather     Review of Systems  Constitutional: Positive for malaise/fatigue (chronic afternoon fatigue, better since stay at home orders). Negative for chills and fever.  HENT: Negative.   Eyes: Negative.   Respiratory: Positive for cough. Negative for shortness of breath.   Cardiovascular: Positive for leg swelling. Negative for chest pain and palpitations.  Gastrointestinal: Negative for abdominal pain, constipation, diarrhea, nausea and vomiting.  Genitourinary: Negative for dysuria, frequency and urgency.  Musculoskeletal: Positive for myalgias.  Neurological: Negative for dizziness, tingling and headaches.  Endo/Heme/Allergies: Positive for environmental allergies.  Psychiatric/Behavioral: Negative for depression. The patient is not nervous/anxious and does not have insomnia.    Per hpi  OBJECTIVE:  Today's Vitals   03/24/19 0822  BP: 117/80  Pulse: 87  Temp: 98.3 F (36.8 C)  TempSrc: Oral  SpO2: 97%  Weight: 169 lb (76.7 kg)  Height: 5' 3"  (1.6 m)   Body mass index is 29.94 kg/m.  Wt Readings from Last 3 Encounters:  03/24/19 169 lb (76.7 kg)  07/26/18 170 lb (77.1 kg)  06/17/18  168 lb 12.8 oz (76.6 kg)    Visual Acuity Screening   Right eye Left eye Both eyes  Without correction:     With correction: 20/20 20/20 20/20     Physical Exam Vitals signs and nursing note reviewed.  Constitutional:      Appearance: She is well-developed.  HENT:     Head: Normocephalic and atraumatic.     Right Ear: Hearing, tympanic membrane, ear canal and external ear normal.     Left Ear: Hearing, tympanic membrane, ear canal and external ear normal.     Mouth/Throat:     Mouth: Mucous membranes are moist.     Pharynx: No oropharyngeal exudate  or posterior oropharyngeal erythema.  Eyes:     Extraocular Movements: Extraocular movements intact.     Conjunctiva/sclera: Conjunctivae normal.     Pupils: Pupils are equal, round, and reactive to light.  Neck:     Musculoskeletal: Neck supple.     Thyroid: No thyromegaly.  Cardiovascular:     Rate and Rhythm: Normal rate and regular rhythm.     Heart sounds: Normal heart sounds. No murmur. No friction rub. No gallop.   Pulmonary:     Effort: Pulmonary effort is normal.     Breath sounds: Normal breath sounds. No wheezing, rhonchi or rales.  Abdominal:     General: Bowel sounds are normal. There is no distension.     Palpations: Abdomen is soft. There is no hepatomegaly, splenomegaly or mass.     Tenderness: There is no abdominal tenderness.  Musculoskeletal:     Right lower leg: No edema.     Left lower leg: She exhibits tenderness (TTP along more proximal aspect of achilles tendon, no erythema, warmth or palpable cords) and swelling. No edema.     Comments: Negative thompson test  Lymphadenopathy:     Cervical: No cervical adenopathy.  Skin:    General: Skin is warm and dry.  Neurological:     Mental Status: She is alert and oriented to person, place, and time.     Cranial Nerves: No cranial nerve deficit.     Gait: Gait normal.     Deep Tendon Reflexes: Reflexes are normal and symmetric.  Psychiatric:        Mood and Affect: Mood normal.        Behavior: Behavior normal.     ASSESSMENT and PLAN  1. Annual physical exam Routine HCM labs ordered. HCM reviewed/discussed. Anticipatory guidance regarding healthy weight, lifestyle and choices given.   2. Screening for lipid disorders - Lipid panel  3. Screening for endocrine, nutritional, metabolic and immunity disorder - CMP14+EGFR  4. Screening for thyroid disorder - TSH  5. Vitamin B12 deficiency - Vitamin B12  6. Vitamin D insufficiency - Vitamin D, 25-hydroxy  7. Achilles tendinitis of left lower extremity  Discussed supportive measures, hand out with exercises given.   8. Exercise induced bronchospasm Albuterol inhaler rx.   Other orders - JUNEL 1/20 1-20 MG-MCG tablet - albuterol (VENTOLIN HFA) 108 (90 Base) MCG/ACT inhaler; Inhale 2 puffs into the lungs every 6 (six) hours as needed for wheezing or shortness of breath.  Return in about 1 year (around 03/23/2020) for CPE.    Rutherford Guys, MD [facility] [place], [place] [address] Ph.  [phone] Fax [phone]

## 2019-03-24 NOTE — Patient Instructions (Signed)
Preventive Care 40-64 Years, Female Preventive care refers to lifestyle choices and visits with your health care provider that can promote health and wellness. What does preventive care include?   A yearly physical exam. This is also called an annual well check.  Dental exams once or twice a year.  Routine eye exams. Ask your health care provider how often you should have your eyes checked.  Personal lifestyle choices, including: ? Daily care of your teeth and gums. ? Regular physical activity. ? Eating a healthy diet. ? Avoiding tobacco and drug use. ? Limiting alcohol use. ? Practicing safe sex. ? Taking low-dose aspirin daily starting at age 50. ? Taking vitamin and mineral supplements as recommended by your health care provider. What happens during an annual well check? The services and screenings done by your health care provider during your annual well check will depend on your age, overall health, lifestyle risk factors, and family history of disease. Counseling Your health care provider may ask you questions about your:  Alcohol use.  Tobacco use.  Drug use.  Emotional well-being.  Home and relationship well-being.  Sexual activity.  Eating habits.  Work and work environment.  Method of birth control.  Menstrual cycle.  Pregnancy history. Screening You may have the following tests or measurements:  Height, weight, and BMI.  Blood pressure.  Lipid and cholesterol levels. These may be checked every 5 years, or more frequently if you are over 50 years old.  Skin check.  Lung cancer screening. You may have this screening every year starting at age 55 if you have a 30-pack-year history of smoking and currently smoke or have quit within the past 15 years.  Colorectal cancer screening. All adults should have this screening starting at age 50 and continuing until age 75. Your health care provider may recommend screening at age 45. You will have tests every  1-10 years, depending on your results and the type of screening test. People at increased risk should start screening at an earlier age. Screening tests may include: ? Guaiac-based fecal occult blood testing. ? Fecal immunochemical test (FIT). ? Stool DNA test. ? Virtual colonoscopy. ? Sigmoidoscopy. During this test, a flexible tube with a tiny camera (sigmoidoscope) is used to examine your rectum and lower colon. The sigmoidoscope is inserted through your anus into your rectum and lower colon. ? Colonoscopy. During this test, a long, thin, flexible tube with a tiny camera (colonoscope) is used to examine your entire colon and rectum.  Hepatitis C blood test.  Hepatitis B blood test.  Sexually transmitted disease (STD) testing.  Diabetes screening. This is done by checking your blood sugar (glucose) after you have not eaten for a while (fasting). You may have this done every 1-3 years.  Mammogram. This may be done every 1-2 years. Talk to your health care provider about when you should start having regular mammograms. This may depend on whether you have a family history of breast cancer.  BRCA-related cancer screening. This may be done if you have a family history of breast, ovarian, tubal, or peritoneal cancers.  Pelvic exam and Pap test. This may be done every 3 years starting at age 21. Starting at age 30, this may be done every 5 years if you have a Pap test in combination with an HPV test.  Bone density scan. This is done to screen for osteoporosis. You may have this scan if you are at high risk for osteoporosis. Discuss your test results, treatment options,   and if necessary, the need for more tests with your health care provider. Vaccines Your health care provider may recommend certain vaccines, such as:  Influenza vaccine. This is recommended every year.  Tetanus, diphtheria, and acellular pertussis (Tdap, Td) vaccine. You may need a Td booster every 10 years.  Varicella  vaccine. You may need this if you have not been vaccinated.  Zoster vaccine. You may need this after age 38.  Measles, mumps, and rubella (MMR) vaccine. You may need at least one dose of MMR if you were born in 1957 or later. You may also need a second dose.  Pneumococcal 13-valent conjugate (PCV13) vaccine. You may need this if you have certain conditions and were not previously vaccinated.  Pneumococcal polysaccharide (PPSV23) vaccine. You may need one or two doses if you smoke cigarettes or if you have certain conditions.  Meningococcal vaccine. You may need this if you have certain conditions.  Hepatitis A vaccine. You may need this if you have certain conditions or if you travel or work in places where you may be exposed to hepatitis A.  Hepatitis B vaccine. You may need this if you have certain conditions or if you travel or work in places where you may be exposed to hepatitis B.  Haemophilus influenzae type b (Hib) vaccine. You may need this if you have certain conditions. Talk to your health care provider about which screenings and vaccines you need and how often you need them. This information is not intended to replace advice given to you by your health care provider. Make sure you discuss any questions you have with your health care provider. Document Released: 11/29/2015 Document Revised: 12/23/2017 Document Reviewed: 09/03/2015 Elsevier Interactive Patient Education  2019 [employer].

## 2019-03-25 LAB — LIPID PANEL
Chol/HDL Ratio: 3.9 ratio (ref 0.0–4.4)
Cholesterol, Total: 201 mg/dL — ABNORMAL HIGH (ref 100–199)
HDL: 51 mg/dL (ref >39)
LDL Calculated: 121 mg/dL — ABNORMAL HIGH (ref 0–99)
Triglycerides: 146 mg/dL (ref 0–149)
VLDL Cholesterol Cal: 29 mg/dL (ref 5–40)

## 2019-03-25 LAB — CMP14+EGFR
ALT: 10 IU/L (ref 0–32)
AST: 17 IU/L (ref 0–40)
Albumin/Globulin Ratio: 1.5 (ref 1.2–2.2)
Albumin: 3.9 g/dL (ref 3.8–4.8)
Alkaline Phosphatase: 60 IU/L (ref 39–117)
BUN/Creatinine Ratio: 11 (ref 9–23)
BUN: 7 mg/dL (ref 6–24)
Bilirubin Total: 0.3 mg/dL (ref 0.0–1.2)
CO2: 21 mmol/L (ref 20–29)
Calcium: 8.5 mg/dL — ABNORMAL LOW (ref 8.7–10.2)
Chloride: 106 mmol/L (ref 96–106)
Creatinine, Ser: 0.65 mg/dL (ref 0.57–1.00)
GFR calc Af Amer: 121 mL/min/{1.73_m2} (ref >59)
GFR calc non Af Amer: 105 mL/min/{1.73_m2} (ref >59)
Globulin, Total: 2.6 g/dL (ref 1.5–4.5)
Glucose: 85 mg/dL (ref 65–99)
Potassium: 4.4 mmol/L (ref 3.5–5.2)
Sodium: 139 mmol/L (ref 134–144)
Total Protein: 6.5 g/dL (ref 6.0–8.5)

## 2019-03-25 LAB — VITAMIN B12: Vitamin B-12: 246 pg/mL (ref 232–1245)

## 2019-03-25 LAB — VITAMIN D 25 HYDROXY (VIT D DEFICIENCY, FRACTURES): Vit D, 25-Hydroxy: 31.8 ng/mL (ref 30.0–100.0)

## 2019-03-25 LAB — TSH: TSH: 1.95 u[IU]/mL (ref 0.450–4.500)

## 2019-12-26 DIAGNOSIS — Z1231 Encounter for screening mammogram for malignant neoplasm of breast: Secondary | ICD-10-CM | POA: Diagnosis not present

## 2019-12-27 ENCOUNTER — Ambulatory Visit: Payer: BC Managed Care – PPO | Attending: Internal Medicine

## 2019-12-27 DIAGNOSIS — Z20822 Contact with and (suspected) exposure to covid-19: Secondary | ICD-10-CM | POA: Insufficient documentation

## 2019-12-28 LAB — NOVEL CORONAVIRUS, NAA: SARS-CoV-2, NAA: NOT DETECTED

## 2020-04-08 ENCOUNTER — Encounter: Payer: Self-pay | Admitting: Family Medicine

## 2020-04-08 DIAGNOSIS — Z1211 Encounter for screening for malignant neoplasm of colon: Secondary | ICD-10-CM

## 2020-04-09 ENCOUNTER — Encounter: Payer: Self-pay | Admitting: Gastroenterology

## 2020-05-15 ENCOUNTER — Ambulatory Visit (AMBULATORY_SURGERY_CENTER): Payer: Self-pay | Admitting: *Deleted

## 2020-05-15 ENCOUNTER — Other Ambulatory Visit: Payer: Self-pay

## 2020-05-15 VITALS — Ht 63.0 in | Wt 167.6 lb

## 2020-05-15 DIAGNOSIS — Z1211 Encounter for screening for malignant neoplasm of colon: Secondary | ICD-10-CM

## 2020-05-15 MED ORDER — SUTAB 1479-225-188 MG PO TABS: 24.0000 | ORAL_TABLET | ORAL | 0 refills | Status: DC

## 2020-05-15 NOTE — Progress Notes (Signed)
No egg or soy allergy known to patient  No issues with past sedation with any surgeries  or procedures, no past  intubation  No diet pills per patient No home 02 use per patient  No blood thinners per patient  Pt denies issues with constipation  No A fib or A flutter  EMMI video sent to pt's e mail  COVID 19 guidelines implemented in [place] today   Completed  covid vaccines 02-09-2020  Due to the COVID-19 pandemic we are asking patients to follow these guidelines. Please only bring one care partner. Please be aware that your care partner may wait in the car in the parking lot or if they feel like they will be too hot to wait in the car, they may wait in the lobby on the 4th floor. All care partners are required to wear a mask the entire time (we do not have any that we can provide them), they need to practice social distancing, and we will do a Covid check for all patient's and care partners when you arrive. Also we will check their temperature and your temperature. If the care partner waits in their car they need to stay in the parking lot the entire time and we will call them on their cell phone when the patient is ready for discharge so they can bring the car to the front of the building. Also all patient's will need to wear a mask into building.

## 2020-05-23 ENCOUNTER — Encounter: Payer: Self-pay | Admitting: Gastroenterology

## 2020-05-29 ENCOUNTER — Encounter: Payer: Self-pay | Admitting: Gastroenterology

## 2020-05-29 ENCOUNTER — Ambulatory Visit (AMBULATORY_SURGERY_CENTER): Payer: BC Managed Care – PPO | Admitting: Gastroenterology

## 2020-05-29 ENCOUNTER — Other Ambulatory Visit: Payer: Self-pay

## 2020-05-29 VITALS — BP 114/63 | HR 63 | Temp 97.1°F | Resp 19 | Ht 63.0 in | Wt 167.0 lb

## 2020-05-29 DIAGNOSIS — Z1211 Encounter for screening for malignant neoplasm of colon: Secondary | ICD-10-CM | POA: Diagnosis not present

## 2020-05-29 DIAGNOSIS — D122 Benign neoplasm of ascending colon: Secondary | ICD-10-CM

## 2020-05-29 DIAGNOSIS — K635 Polyp of colon: Secondary | ICD-10-CM | POA: Diagnosis not present

## 2020-05-29 MED ORDER — SODIUM CHLORIDE 0.9 % IV SOLN
500.0000 mL | Freq: Once | INTRAVENOUS | Status: DC
Start: 2020-05-29 — End: 2020-05-29

## 2020-05-29 NOTE — Patient Instructions (Signed)
YOU HAD AN ENDOSCOPIC PROCEDURE TODAY AT THE [facility]:   Refer to the procedure report that was given to you for any specific questions about what was found during the examination.  If the procedure report does not answer your questions, please call your gastroenterologist to clarify.  If you requested that your care partner not be given the details of your procedure findings, then the procedure report has been included in a sealed envelope for you to review at your convenience later.  **Handout given on polyps and Hemorrhoids**  YOU SHOULD EXPECT: Some feelings of bloating in the abdomen. Passage of more gas than usual.  Walking can help get rid of the air that was put into your GI tract during the procedure and reduce the bloating. If you had a lower endoscopy (such as a colonoscopy or flexible sigmoidoscopy) you may notice spotting of blood in your stool or on the toilet paper. If you underwent a bowel prep for your procedure, you may not have a normal bowel movement for a few days.  Please Note:  You might notice some irritation and congestion in your nose or some drainage.  This is from the oxygen used during your procedure.  There is no need for concern and it should clear up in a day or so.  SYMPTOMS TO REPORT IMMEDIATELY:  Following lower endoscopy (colonoscopy or flexible sigmoidoscopy):  Excessive amounts of blood in the stool  Significant tenderness or worsening of abdominal pains  Swelling of the abdomen that is new, acute  Fever of 100F or higher  For urgent or emergent issues, a gastroenterologist can be reached at any hour by calling [phone]. Do not use MyChart messaging for urgent concerns.    DIET:  We do recommend a small meal at first, but then you may proceed to your regular diet.  Drink plenty of fluids but you should avoid alcoholic beverages for 24 hours.  ACTIVITY:  You should plan to take it easy for the rest of today and you should NOT DRIVE or  use heavy machinery until tomorrow (because of the sedation medicines used during the test).    FOLLOW UP: Our staff will call the number listed on your records 48-72 hours following your procedure to check on you and address any questions or concerns that you may have regarding the information given to you following your procedure. If we do not reach you, we will leave a message.  We will attempt to reach you two times.  During this call, we will ask if you have developed any symptoms of COVID 19. If you develop any symptoms (ie: fever, flu-like symptoms, shortness of breath, cough etc.) before then, please call [phone].  If you test positive for Covid 19 in the 2 weeks post procedure, please call and report this information to us.    If any biopsies were taken you will be contacted by phone or by letter within the next 1-3 weeks.  Please call us at [phone] if you have not heard about the biopsies in 3 weeks.    SIGNATURES/CONFIDENTIALITY: You and/or your care partner have signed paperwork which will be entered into your electronic medical record.  These signatures attest to the fact that that the information above on your After Visit Summary has been reviewed and is understood.  Full responsibility of the confidentiality of this discharge information lies with you and/or your care-partner.  

## 2020-05-29 NOTE — Op Note (Signed)
[facility] Patient Name: Cheryl Hutchinson Procedure Date: 05/29/2020 10:51 AM MRN: [id number] Endoscopist: Thornton Park MD, MD Age: 49 Referring MD:  Date of Birth: [birth year] Gender: Female Account #: [id number] Procedure:                Colonoscopy Indications:              Screening for colorectal malignant neoplasm, This                            is the patient's first colonoscopy                           No first degree relatives with colon cancer or                            polyps Medicines:                Monitored Anesthesia Care Procedure:                Pre-Anesthesia Assessment:                           - Prior to the procedure, a History and Physical                            was performed, and patient medications and                            allergies were reviewed. The patient's tolerance of                            previous anesthesia was also reviewed. The risks                            and benefits of the procedure and the sedation                            options and risks were discussed with the patient.                            All questions were answered, and informed consent                            was obtained. Prior Anticoagulants: The patient has                            taken no previous anticoagulant or antiplatelet                            agents. ASA Grade Assessment: II - A patient with                            mild systemic disease. After reviewing the risks  and benefits, the patient was deemed in                            satisfactory condition to undergo the procedure.                           After obtaining informed consent, the colonoscope                            was passed under direct vision. Throughout the                            procedure, the patient's blood pressure, pulse, and                            oxygen saturations were monitored continuously. The                             Colonoscope was introduced through the anus and                            advanced to the 3 cm into the ileum. A second                            forward view of the right colon was performed. The                            colonoscopy was performed without difficulty. The                            patient tolerated the procedure well. The quality                            of the bowel preparation was excellent. The                            terminal ileum, ileocecal valve, appendiceal                            orifice, and rectum were photographed. Scope In: 11:02:55 AM Scope Out: 11:15:15 AM Scope Withdrawal Time: 0 hours 9 minutes 6 seconds  Total Procedure Duration: 0 hours 12 minutes 20 seconds  Findings:                 Non-bleeding external and internal hemorrhoids were                            found.                           A 20 mm polyp was found in the proximal ascending                            colon. The polyp was flat. The polyp was removed  with a piecemeal technique using a cold snare.                            Resection and retrieval were complete. Estimated                            blood loss was minimal.                           The exam was otherwise without abnormality on                            direct and retroflexion views. Complications:            No immediate complications. Estimated blood loss:                            Minimal. Estimated Blood Loss:     Estimated blood loss: none. Impression:               - Non-bleeding external and internal hemorrhoids.                           - One 20 mm polyp in the proximal ascending colon,                            removed piecemeal using a cold snare. Resected and                            retrieved.                           - The examination was otherwise normal on direct                            and retroflexion views. Recommendation:           - Patient has  a contact number available for                            emergencies. The signs and symptoms of potential                            delayed complications were discussed with the                            patient. Return to normal activities tomorrow.                            Written discharge instructions were provided to the                            patient.                           - Resume previous diet.                           -  Continue present medications.                           - Await pathology results.                           - Repeat colonoscopy date to be determined after                            pending pathology results are reviewed for                            surveillance.                           - Emerging evidence supports eating a diet of                            fruits, vegetables, grains, calcium, and yogurt                            while reducing red meat and alcohol may reduce the                            risk of colon cancer.                           - Thank you for allowing me to be involved in your                            colon cancer prevention. Thornton Park MD, MD 05/29/2020 11:21:54 AM This report has been signed electronically.

## 2020-05-29 NOTE — Progress Notes (Signed)
Called to room to assist during endoscopic procedure.  Patient ID and intended procedure confirmed with present staff. Received instructions for my participation in the procedure from the performing physician.  

## 2020-05-29 NOTE — Progress Notes (Signed)
Pt's states no medical or surgical changes since previsit or office visit.  Cw vitals and KW IV. 

## 2020-05-29 NOTE — Progress Notes (Signed)
PT taken to PACU. Monitors in place. VSS. Report given to RN. 

## 2020-05-31 ENCOUNTER — Telehealth: Payer: Self-pay

## 2020-05-31 ENCOUNTER — Telehealth: Payer: Self-pay | Admitting: *Deleted

## 2020-05-31 NOTE — Telephone Encounter (Signed)
Attempted to reach pt. With follow-up call following endoscopic procedure 05/29/2020.  Phone rang, then stopped.  No busy signal or answering machine pick-up.  Unable to LM.  Will try to reach pt. Again later today.

## 2020-05-31 NOTE — Telephone Encounter (Signed)
  Follow up Call-  Call back number 05/29/2020  Post procedure Call Back phone  # [phone]  Permission to leave phone message Yes  Some recent data might be hidden     Patient questions:  Do you have a fever, pain , or abdominal swelling? No. Pain Score  0 *  Have you tolerated food without any problems? Yes.    Have you been able to return to your normal activities? Yes.    Do you have any questions about your discharge instructions: Diet   No. Medications  No. Follow up visit  No.  Do you have questions or concerns about your Care? No.  Actions: * If pain score is 4 or above: 1. No action needed, pain <4.Have you developed a fever since your procedure? no  2.   Have you had an respiratory symptoms (SOB or cough) since your procedure? no  3.   Have you tested positive for COVID 19 since your procedure no  4.   Have you had any family members/close contacts diagnosed with the COVID 19 since your procedure?  no   If yes to any of these questions please route to Joylene John, RN and Erenest Rasher, RN

## 2020-06-06 ENCOUNTER — Encounter: Payer: Self-pay | Admitting: Gastroenterology

## 2020-09-18 ENCOUNTER — Encounter: Payer: Self-pay | Admitting: *Deleted

## 2020-11-29 DIAGNOSIS — Z1152 Encounter for screening for COVID-19: Secondary | ICD-10-CM | POA: Diagnosis not present

## 2020-12-09 DIAGNOSIS — Z01419 Encounter for gynecological examination (general) (routine) without abnormal findings: Secondary | ICD-10-CM | POA: Diagnosis not present

## 2020-12-09 DIAGNOSIS — Z124 Encounter for screening for malignant neoplasm of cervix: Secondary | ICD-10-CM | POA: Diagnosis not present

## 2020-12-09 DIAGNOSIS — Z3040 Encounter for surveillance of contraceptives, unspecified: Secondary | ICD-10-CM | POA: Diagnosis not present

## 2020-12-09 DIAGNOSIS — Z1231 Encounter for screening mammogram for malignant neoplasm of breast: Secondary | ICD-10-CM | POA: Diagnosis not present

## 2020-12-09 DIAGNOSIS — Z6829 Body mass index (BMI) 29.0-29.9, adult: Secondary | ICD-10-CM | POA: Diagnosis not present

## 2020-12-20 DIAGNOSIS — Z139 Encounter for screening, unspecified: Secondary | ICD-10-CM | POA: Diagnosis not present

## 2021-01-09 DIAGNOSIS — E7800 Pure hypercholesterolemia, unspecified: Secondary | ICD-10-CM | POA: Diagnosis not present

## 2021-01-10 DIAGNOSIS — F3289 Other specified depressive episodes: Secondary | ICD-10-CM | POA: Diagnosis not present

## 2021-01-23 DIAGNOSIS — F3289 Other specified depressive episodes: Secondary | ICD-10-CM | POA: Diagnosis not present

## 2021-01-29 DIAGNOSIS — F3289 Other specified depressive episodes: Secondary | ICD-10-CM | POA: Diagnosis not present

## 2021-02-19 DIAGNOSIS — F3289 Other specified depressive episodes: Secondary | ICD-10-CM | POA: Diagnosis not present

## 2021-03-14 DIAGNOSIS — F3289 Other specified depressive episodes: Secondary | ICD-10-CM | POA: Diagnosis not present

## 2021-04-21 DIAGNOSIS — F3289 Other specified depressive episodes: Secondary | ICD-10-CM | POA: Diagnosis not present

## 2021-05-21 DIAGNOSIS — F3289 Other specified depressive episodes: Secondary | ICD-10-CM | POA: Diagnosis not present

## 2021-06-10 DIAGNOSIS — Z13220 Encounter for screening for lipoid disorders: Secondary | ICD-10-CM | POA: Diagnosis not present

## 2021-06-10 DIAGNOSIS — Z0000 Encounter for general adult medical examination without abnormal findings: Secondary | ICD-10-CM | POA: Diagnosis not present

## 2021-06-10 DIAGNOSIS — G44209 Tension-type headache, unspecified, not intractable: Secondary | ICD-10-CM | POA: Diagnosis not present

## 2021-06-10 DIAGNOSIS — J309 Allergic rhinitis, unspecified: Secondary | ICD-10-CM | POA: Diagnosis not present

## 2021-06-13 DIAGNOSIS — F3289 Other specified depressive episodes: Secondary | ICD-10-CM | POA: Diagnosis not present

## 2021-07-04 DIAGNOSIS — Z23 Encounter for immunization: Secondary | ICD-10-CM | POA: Diagnosis not present

## 2021-07-07 DIAGNOSIS — F3289 Other specified depressive episodes: Secondary | ICD-10-CM | POA: Diagnosis not present

## 2021-07-10 DIAGNOSIS — K921 Melena: Secondary | ICD-10-CM | POA: Diagnosis not present

## 2021-08-08 DIAGNOSIS — F3289 Other specified depressive episodes: Secondary | ICD-10-CM | POA: Diagnosis not present

## 2021-08-25 ENCOUNTER — Other Ambulatory Visit: Payer: Self-pay

## 2021-08-25 ENCOUNTER — Encounter: Payer: Self-pay | Admitting: Allergy

## 2021-08-25 ENCOUNTER — Ambulatory Visit: Payer: BC Managed Care – PPO | Admitting: Allergy

## 2021-08-25 VITALS — BP 122/84 | HR 90 | Temp 97.9°F | Resp 18 | Ht 60.0 in | Wt 196.8 lb

## 2021-08-25 DIAGNOSIS — L503 Dermatographic urticaria: Secondary | ICD-10-CM | POA: Diagnosis not present

## 2021-08-25 DIAGNOSIS — L299 Pruritus, unspecified: Secondary | ICD-10-CM | POA: Diagnosis not present

## 2021-08-25 DIAGNOSIS — L2089 Other atopic dermatitis: Secondary | ICD-10-CM | POA: Insufficient documentation

## 2021-08-25 DIAGNOSIS — J3089 Other allergic rhinitis: Secondary | ICD-10-CM

## 2021-08-25 DIAGNOSIS — Z880 Allergy status to penicillin: Secondary | ICD-10-CM | POA: Diagnosis not present

## 2021-08-25 MED ORDER — MONTELUKAST SODIUM 10 MG PO TABS: 10.0000 mg | ORAL_TABLET | Freq: Every day | ORAL | 5 refills | Status: DC

## 2021-08-25 NOTE — Assessment & Plan Note (Signed)
Flares on the hands.  . See below for proper skincare. . Use hydrocortisone 2.5% cream twice a day as needed for mild rash flares - okay to use on the face, neck, groin area. Do not use more than 1 week at a time.

## 2021-08-25 NOTE — Assessment & Plan Note (Signed)
Perennial rhinoconjunctivitis symptoms for many years but worsening since moved to [place].  Using Zyrtec daily also for itching with good benefit.  Did not like taking Flonase.  Skin testing over 10 years ago was negative per patient report.  Was on allergy immunotherapy as a child.  Today's skin prick testing showed: Positive to dust mites and one mold.  . Get bloodwork instead of intradermal due to dermatographism.  o If negative will refer to ENT next.   Start environmental control measures as below.  Use over the counter antihistamines such as Zyrtec (cetirizine), Claritin (loratadine), Allegra (fexofenadine), or Xyzal (levocetirizine) daily as needed. May take twice a day during allergy flares. May switch antihistamines every few months.  Start Singulair (montelukast) 10mg  daily at night - first.   Cautioned that in some children/adults can experience behavioral changes including hyperactivity, agitation, depression, sleep disturbances and suicidal ideations. These side effects are rare, but if you notice them you should notify me and discontinue Singulair (montelukast).  Use Nasacort (triamcinolone) nasal spray 1 spray per nostril twice a day as needed for nasal congestion. Sample given.  Nasal saline spray (i.e., Simply Saline) or nasal saline lavage (i.e., NeilMed) is recommended as needed and prior to medicated nasal sprays.

## 2021-08-25 NOTE — Progress Notes (Signed)
New Patient Note  RE: Cheryl Hutchinson MRN: [id number] DOB: 22-Nov-1970 Date of Office Visit: 08/25/2021  Consult requested by: Ephriam Jenkins, PA Primary care provider: Caren Macadam, MD  Chief Complaint: Allergic Rhinitis  (Spring was horrible from march to June. Had allergies since child hood and was on allergy shots at a young age (5-6). She has been on zyrtec once a day for years and has the same result. Would like to try something new. Last allergy test was 12 years ago all negative result. Last zyrtec was Thursday night. - Congestion and itching since being off allergy medication )  History of Present Illness: I had the pleasure of seeing Cheryl Hutchinson for initial evaluation at the [facility] on 08/25/2021. She is a 50 y.o. female, who is referred here by Caren Macadam, MD for the evaluation of allergic rhinitis.  She reports symptoms of nasal congestion, sinus pain, rhinorrhea, sneezing, watery eyes. Symptoms have been going on for many years but worse the past spring since moved to [place] 5 years ago. The symptoms are present all year around with worsening in spring and fall. Anosmia: no. Headache: sometimes. She has used zyrtec with some improvement in symptoms. Tried Flonase but didn't like it. She is taking zyrtec 10mg  daily for itching with good benefit.  Sinus infections: no. Previous work up includes: skin testing over 10+ years ago which was negative in [place]. Patient was on allergy injections as a child for a few years with some benefit.   July 2022 bloodwork - CBC diff, TSH, CMP normal.   Previous ENT evaluation: no. Previous sinus imaging: no. History of nasal polyps: no. Last eye exam: 2 weeks ago. History of reflux: not recently.  Assessment and Plan: Cheryl is a 50 y.o. female with: Other allergic rhinitis Perennial rhinoconjunctivitis symptoms for many years but worsening since moved to [place].  Using Zyrtec daily also for itching with good  benefit.  Did not like taking Flonase.  Skin testing over 10 years ago was negative per patient report.  Was on allergy immunotherapy as a child. Today's skin prick testing showed: Positive to dust mites and one mold.  Get bloodwork instead of intradermal due to dermatographism.  If negative will refer to ENT next.  Start environmental control measures as below. Use over the counter antihistamines such as Zyrtec (cetirizine), Claritin (loratadine), Allegra (fexofenadine), or Xyzal (levocetirizine) daily as needed. May take twice a day during allergy flares. May switch antihistamines every few months. Start Singulair (montelukast) 10mg  daily at night - first.  Cautioned that in some children/adults can experience behavioral changes including hyperactivity, agitation, depression, sleep disturbances and suicidal ideations. These side effects are rare, but if you notice them you should notify me and discontinue Singulair (montelukast). Use Nasacort (triamcinolone) nasal spray 1 spray per nostril twice a day as needed for nasal congestion. Sample given. Nasal saline spray (i.e., Simply Saline) or nasal saline lavage (i.e., NeilMed) is recommended as needed and prior to medicated nasal sprays.  Pruritus Itching for many years with no triggers. Zyrtec 10mg  daily helps. Continue zyrtec 10mg  daily as before.  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  Other atopic dermatitis Flares on the hands.  See below for proper skincare. Use hydrocortisone 2.5% cream twice a day as needed for mild rash flares - okay to use on the face, neck, groin area. Do not use more than 1 week at a time.  Penicillin allergy Rash over 25  years ago. Consider penicillin allergy skin testing and in office drug challenge in the future.  Over 90% of people with history of penicillin allergy which occurred over 10 years ago are found to be non-allergic.   Return in about 2 months (around 10/25/2021).  Meds ordered  this encounter  Medications   montelukast (SINGULAIR) 10 MG tablet    Sig: Take 1 tablet (10 mg total) by mouth at bedtime.    Dispense:  30 tablet    Refill:  5    Lab Orders         Allergens w/Total IgE Area 2      Other allergy screening: Asthma: no Food allergy: no Medication allergy: yes Amoxicillin - caused rash over 25 years ago. Hymenoptera allergy: no Urticaria: no Eczema:yes as a child  And periodically gets flares on the hands and uses OTC hydrocortisone as needed with good benefit.  History of recurrent infections suggestive of immunodeficency: no  Diagnostics: Skin Testing: Environmental allergy panel and select foods. Positive to dust mites and one mold.  Results discussed with patient/family.  Airborne Adult Perc - 08/25/21 1415     Time Antigen Placed 1416    Allergen Manufacturer Lavella Hammock    Location Back    Number of Test 59    1. Control-Buffer 50% Glycerol Negative    2. Control-Histamine 1 mg/ml 2+    3. Albumin saline Negative    4. Aristes Negative    5. Guatemala Negative    6. Johnson Negative    7. Kemp Blue Negative    8. Meadow Fescue Negative    9. Perennial Rye Negative    10. Sweet Vernal Negative    11. Timothy Negative    12. Cocklebur Negative    13. Burweed Marshelder Negative    14. Ragweed, short Negative    15. Ragweed, Giant Negative    16. Plantain,  English Negative    17. Lamb's Quarters Negative    18. Sheep Sorrell Negative    19. Rough Pigweed Negative    20. Marsh Elder, Rough Negative    21. Mugwort, Common Negative    22. Ash mix Negative    23. Birch mix Negative    24. Beech American Negative    25. Box, Elder Negative    26. Cedar, red Negative    27. Cottonwood, [place] Negative    28. Elm mix Negative    29. Hickory Negative    30. Maple mix Negative    31. Oak, [place] mix Negative    32. Pecan Pollen Negative    33. Pine mix Negative    34. Sycamore Eastern Negative    35. [place]te, Black Pollen  Negative    36. Alternaria alternata Negative    37. Cladosporium Herbarum Negative    38. Aspergillus mix Negative    39. Penicillium mix Negative    40. Bipolaris sorokiniana (Helminthosporium) Negative    41. Drechslera spicifera (Curvularia) 2+    42. Mucor plumbeus Negative    43. Fusarium moniliforme Negative    44. Aureobasidium pullulans (pullulara) Negative    45. Rhizopus oryzae Negative    46. Botrytis cinera Negative    47. Epicoccum nigrum Negative    48. Phoma betae Negative    49. Candida Albicans Negative    50. Trichophyton mentagrophytes Negative    51. Mite, D Farinae  5,000 AU/ml 2+    52. Mite, D Pteronyssinus  5,000 AU/ml 2+    53. Cat  Hair 10,000 BAU/ml Negative    54.  Dog Epithelia Negative    55. Mixed Feathers Negative    56. Horse Epithelia Negative    57. Cockroach, German Negative    58. Mouse Negative    59. Tobacco Leaf Negative             Food Perc - 08/25/21 1416       Test Information   Time Antigen Placed 1416    Allergen Manufacturer Lavella Hammock    Location Back    Number of allergen test 10      Food   1. Peanut Negative    2. Soybean food Negative    3. Wheat, whole Negative    4. Sesame Negative    5. Milk, cow Negative    6. Egg White, chicken Negative    7. Casein Negative    8. Shellfish mix Negative    9. Fish mix Negative    10. Cashew Negative             Past Medical History: Patient Active Problem List   Diagnosis Date Noted   Other allergic rhinitis 08/25/2021   Dermatographism 08/25/2021   Pruritus 08/25/2021   Penicillin allergy 08/25/2021   Other atopic dermatitis 08/25/2021    Past Medical History:  Diagnosis Date   Allergy    GERD (gastroesophageal reflux disease)    some and heartburn issues 2017- mostly Spring ? allergy related    Past Surgical History: Past Surgical History:  Procedure Laterality Date   CESAREAN SECTION     SIGMOIDOSCOPY  2007   [place] - due to blood in stool-  Normal per pt    UPPER GASTROINTESTINAL ENDOSCOPY     Done for GERD -2017 - [facility] EXTRACTION     with sedation    Medication List:  Current Outpatient Medications  Medication Sig Dispense Refill   ibuprofen (ADVIL) 200 MG tablet Take 200 mg by mouth every 6 (six) hours as needed.     JUNEL 1/20 1-20 MG-MCG tablet      L-LYSINE PO Take by mouth. As needed for fever blisters     montelukast (SINGULAIR) 10 MG tablet Take 1 tablet (10 mg total) by mouth at bedtime. 30 tablet 5   Probiotic Product (PROBIOTIC PO) Take by mouth. Gummies     albuterol (VENTOLIN HFA) 108 (90 Base) MCG/ACT inhaler Inhale 2 puffs into the lungs every 6 (six) hours as needed for wheezing or shortness of breath. (Patient not taking: Reported on 08/25/2021) 1 Inhaler 0   cetirizine (ZYRTEC) 10 MG chewable tablet Chew 10 mg by mouth daily. (Patient not taking: Reported on 08/25/2021)     No current facility-administered medications for this visit.   Allergies: Allergies  Allergen Reactions   Amoxicillin Rash   Social History: Social History   Socioeconomic History   Marital status: Married    Spouse name: Not on file   Number of children: Not on file   Years of education: Not on file   Highest education level: Not on file  Occupational History   Not on file  Tobacco Use   Smoking status: Never   Smokeless tobacco: Never  Substance and Sexual Activity   Alcohol use: Yes    Comment: 1-2 glasses a week    Drug use: Not Currently   Sexual activity: Yes  Other Topics Concern   Not on file  Social History Narrative  Not on file   Social Determinants of Health   Financial Resource Strain: Not on file  Food Insecurity: Not on file  Transportation Needs: Not on file  Physical Activity: Not on file  Stress: Not on file  Social Connections: Not on file   Lives in a house built in 1939. Smoking: denies Occupation: asl/english Landscape architect HistoryJournalist, newspaper in the house: no Charity fundraiser in the family room: no Carpet in the bedroom: no Heating: gas Cooling: central Pet: yes 2 cats  x 9 yrs  Family History: Family History  Problem Relation Age of Onset   COPD Mother    Cancer Father    Brain cancer Father        Glioblastoma    Cancer Maternal Grandmother    Breast cancer Maternal Grandmother 84   Heart disease Maternal Grandfather    Stomach cancer Paternal Grandmother    Colon cancer Cousin 35       Maternal side    Colon polyps Neg Hx    Esophageal cancer Neg Hx    Problem                               Relation Asthma                                   No  Eczema                                No  Food allergy                          No  Allergic rhino conjunctivitis     No   Review of Systems  Constitutional:  Negative for appetite change, chills, fever and unexpected weight change.  HENT:  Positive for congestion. Negative for rhinorrhea.   Eyes:  Negative for itching.  Respiratory:  Negative for cough, chest tightness, shortness of breath and wheezing.   Cardiovascular:  Negative for chest pain.  Gastrointestinal:  Negative for abdominal pain.  Genitourinary:  Negative for difficulty urinating.  Skin:  Negative for rash.  Allergic/Immunologic: Positive for environmental allergies. Negative for food allergies.  Neurological:  Positive for headaches.   Objective: BP 122/84   Pulse 90   Temp 97.9 F (36.6 C)   Resp 18   Ht 5' (1.524 m)   Wt 196 lb 12.8 oz (89.3 kg)   SpO2 96%   BMI 38.43 kg/m  Body mass index is 38.43 kg/m. Physical Exam Vitals and nursing note reviewed.  Constitutional:      Appearance: Normal appearance. She is well-developed.  HENT:     Head: Normocephalic and atraumatic.     Right Ear: Tympanic membrane and external ear normal.     Left Ear: Tympanic membrane and external ear normal.     Nose: Nose normal.     Mouth/Throat:     Mouth: Mucous membranes are moist.     Pharynx:  Oropharynx is clear.  Eyes:     Conjunctiva/sclera: Conjunctivae normal.  Cardiovascular:     Rate and Rhythm: Normal rate and regular rhythm.     Heart sounds: Normal heart sounds. No murmur heard.   No friction rub. No gallop.  Pulmonary:  Effort: Pulmonary effort is normal.     Breath sounds: Normal breath sounds. No wheezing, rhonchi or rales.  Musculoskeletal:     Cervical back: Neck supple.  Skin:    General: Skin is warm.     Findings: Rash present.     Comments: +2 dermatographism  Neurological:     Mental Status: She is alert and oriented to person, place, and time.  Psychiatric:        Behavior: Behavior normal.  The plan was reviewed with the patient/family, and all questions/concerned were addressed.  It was my pleasure to see Cheryl today and participate in her care. Please feel free to contact me with any questions or concerns.  Sincerely,  Rexene Alberts, DO Allergy & Immunology  [facility] office: [place] office: [phone]

## 2021-08-25 NOTE — Assessment & Plan Note (Signed)
Itching for many years with no triggers. Zyrtec 10mg  daily helps. . Continue zyrtec 10mg  daily as before.

## 2021-08-25 NOTE — Patient Instructions (Addendum)
Today's skin testing showed: Positive to dust mites and one mold.   Environmental allergies Get bloodwork If negative will refer to ENT next.  We are ordering labs, so please allow 1-2 weeks for the results to come back. With the newly implemented Cures Act, the labs might be visible to you at the same time that they become visible to me. However, I will not address the results until all of the results are back, so please be patient.  In the meantime, continue recommendations in your patient instructions, including avoidance measures (if applicable), until you hear from me. Start environmental control measures as below. Use over the counter antihistamines such as Zyrtec (cetirizine), Claritin (loratadine), Allegra (fexofenadine), or Xyzal (levocetirizine) daily as needed. May take twice a day during allergy flares. May switch antihistamines every few months. Start Singulair (montelukast) 10mg  daily at night - first.  Cautioned that in some children/adults can experience behavioral changes including hyperactivity, agitation, depression, sleep disturbances and suicidal ideations. These side effects are rare, but if you notice them you should notify me and discontinue Singulair (montelukast). Use Nasacort (triamcinolone) nasal spray 1 spray per nostril twice a day as needed for nasal congestion. Sample given. Nasal saline spray (i.e., Simply Saline) or nasal saline lavage (i.e., NeilMed) is recommended as needed and prior to medicated nasal sprays.  Itching: Continue zyrtec 10mg  daily as before. You may also have dermatographism.  Atopic dermatitis: See below for proper skincare. Use hydrocortisone 2.5% cream twice a day as needed for mild rash flares - okay to use on the face, neck, groin area. Do not use more than 1 week at a time.  Penicillin allergy: Consider penicillin allergy skin testing and in office drug challenge in the future.  Over 90% of people with history of penicillin allergy  which occurred over 10 years ago are found to be non-allergic.  You must be off antihistamines for 3-5 days before. Plan on being in the office for 2-3 hours. You must call to schedule an appointment and specify it's for a drug challenge.  A few days prior to the appointment, I will send in a prescription for amoxicillin liquid which you must bring to the appointment as well.   Follow up in 2 months or sooner if needed.    Control of House Dust Mite Allergen Dust mite allergens are a common trigger of allergy and asthma symptoms. While they can be found throughout the house, these microscopic creatures thrive in warm, humid environments such as bedding, upholstered furniture and carpeting. Because so much time is spent in the bedroom, it is essential to reduce mite levels there.  Encase pillows, mattresses, and box springs in special allergen-proof fabric covers or airtight, zippered plastic covers.  Bedding should be washed weekly in hot water (130 F) and dried in a hot dryer. Allergen-proof covers are available for comforters and pillows that can't be regularly washed.  Wash the allergy-proof covers every few months. Minimize clutter in the bedroom. Keep pets out of the bedroom.  Keep humidity less than 50% by using a dehumidifier or air conditioning. You can buy a humidity measuring device called a hygrometer to monitor this.  If possible, replace carpets with hardwood, linoleum, or washable area rugs. If that's not possible, vacuum frequently with a vacuum that has a HEPA filter. Remove all upholstered furniture and non-washable window drapes from the bedroom. Remove all non-washable stuffed toys from the bedroom.  Wash stuffed toys weekly. Mold Control Mold and fungi can grow on  a variety of surfaces provided certain temperature and moisture conditions exist.  Outdoor molds grow on plants, decaying vegetation and soil. The major outdoor mold, Alternaria and Cladosporium, are found in very  high numbers during hot and dry conditions. Generally, a late summer - fall peak is seen for common outdoor fungal spores. Rain will temporarily lower outdoor mold spore count, but counts rise rapidly when the rainy period ends. The most important indoor molds are Aspergillus and Penicillium. Dark, humid and poorly ventilated basements are ideal sites for mold growth. The next most common sites of mold growth are the bathroom and the kitchen. Outdoor (Seasonal) Mold Control Use air conditioning and keep windows closed. Avoid exposure to decaying vegetation. Avoid leaf raking. Avoid grain handling. Consider wearing a face mask if working in moldy areas.  Indoor (Perennial) Mold Control  Maintain humidity below 50%. Get rid of mold growth on hard surfaces with water, detergent and, if necessary, 5% bleach (do not mix with other cleaners). Then dry the area completely. If mold covers an area more than 10 square feet, consider hiring an indoor environmental professional. For clothing, washing with soap and water is best. If moldy items cannot be cleaned and dried, throw them away. Remove sources e.g. contaminated carpets. Repair and seal leaking roofs or pipes. Using dehumidifiers in damp basements may be helpful, but empty the water and clean units regularly to prevent mildew from forming. All rooms, especially basements, bathrooms and kitchens, require ventilation and cleaning to deter mold and mildew growth. Avoid carpeting on concrete or damp floors, and storing items in damp areas.   Skin care recommendations  Bath time: Always use lukewarm water. AVOID very hot or cold water. Keep bathing time to 5-10 minutes. Do NOT use bubble bath. Use a mild soap and use just enough to wash the dirty areas. Do NOT scrub skin vigorously.  After bathing, pat dry your skin with a towel. Do NOT rub or scrub the skin.  Moisturizers and prescriptions:  ALWAYS apply moisturizers immediately after bathing  (within 3 minutes). This helps to lock-in moisture. Use the moisturizer several times a day over the whole body. Good summer moisturizers include: Aveeno, CeraVe, Cetaphil. Good winter moisturizers include: Aquaphor, Vaseline, Cerave, Cetaphil, Eucerin, Vanicream. When using moisturizers along with medications, the moisturizer should be applied about one hour after applying the medication to prevent diluting effect of the medication or moisturize around where you applied the medications. When not using medications, the moisturizer can be continued twice daily as maintenance.  Laundry and clothing: Avoid laundry products with added color or perfumes. Use unscented hypo-allergenic laundry products such as Tide free, Cheer free & gentle, and All free and clear.  If the skin still seems dry or sensitive, you can try double-rinsing the clothes. Avoid tight or scratchy clothing such as wool. Do not use fabric softeners or dyer sheets.   Buffered Isotonic Saline Irrigations:  Goal: When you irrigate with the isotonic saline (salt water) it washes mucous and other debris from your nose that could be contributing to your nasal symptoms.   Recipe: Obtain 1 quart jar that is clean Fill with clean (bottled, boiled or distilled) water Add 1-2 heaping teaspoons of salt without iodine If the solution with 2 teaspoons of salt is too strong, adjust the amount down until better tolerated Add 1 teaspoon of Arm [employer] Hammer baking soda (pure bicarbonate) Mix ingredients together and store at room temperature and discard after 1 week * Alternatively you can buy  pre made salt packets for the NeilMed bottle or there          are other over the counter brands available  Instructions: Warm  cup of the solution in the microwave if desired but be careful not to overheat as this will burn the inside of your nose Stand over a sink (or do it while you shower) and squirt the solution into one side of your nose aiming  towards the back of your head Sometimes saying "coca cola" while irrigating can be helpful to prevent fluid from going down your throat  The solution will travel to the back of your nose and then come out the other side Perform this again on the other side Try to do this twice a day If you are using a nasal spray in addition to the irrigation, irrigate first and then use the topical nasal spray otherwise you will wash the nasal spray out of your nose

## 2021-08-25 NOTE — Assessment & Plan Note (Signed)
.   See assessment and plan as above. 

## 2021-08-25 NOTE — Assessment & Plan Note (Signed)
Rash over 25 years ago.  Consider penicillin allergy skin testing and in office drug challenge in the future.   Over 90% of people with history of penicillin allergy which occurred over 10 years ago are found to be non-allergic.

## 2021-08-29 DIAGNOSIS — Z23 Encounter for immunization: Secondary | ICD-10-CM | POA: Diagnosis not present

## 2021-09-01 LAB — ALLERGENS W/TOTAL IGE AREA 2
Alternaria Alternata IgE: <0.1 kU/L
Aspergillus Fumigatus IgE: <0.1 kU/L
Bermuda Grass IgE: <0.1 kU/L
Cat Dander IgE: <0.1 kU/L
Cedar, Mountain IgE: 0.13 kU/L — AB
Cladosporium Herbarum IgE: <0.1 kU/L
Cockroach, German IgE: <0.1 kU/L
Common Silver Birch IgE: 0.13 kU/L — AB
Cottonwood IgE: 0.1 kU/L — AB
D Farinae IgE: 0.1 kU/L — AB
D Pteronyssinus IgE: 0.13 kU/L — AB
Dog Dander IgE: <0.1 kU/L
Elm, American IgE: 0.13 kU/L — AB
IgE (Immunoglobulin E), Serum: 101 IU/mL (ref 6–495)
Johnson Grass IgE: <0.1 kU/L
Maple/Box Elder IgE: 0.1 kU/L — AB
Mouse Urine IgE: <0.1 kU/L
Oak, White IgE: 0.11 kU/L — AB
Pecan, Hickory IgE: <0.1 kU/L
Penicillium Chrysogen IgE: <0.1 kU/L
Pigweed, Rough IgE: <0.1 kU/L
Ragweed, Short IgE: <0.1 kU/L
Sheep Sorrel IgE Qn: <0.1 kU/L
Timothy Grass IgE: <0.1 kU/L
White Mulberry IgE: <0.1 kU/L

## 2021-09-05 DIAGNOSIS — F3289 Other specified depressive episodes: Secondary | ICD-10-CM | POA: Diagnosis not present

## 2021-09-12 DIAGNOSIS — H6592 Unspecified nonsuppurative otitis media, left ear: Secondary | ICD-10-CM | POA: Diagnosis not present

## 2021-09-18 DIAGNOSIS — F3289 Other specified depressive episodes: Secondary | ICD-10-CM | POA: Diagnosis not present

## 2021-09-23 ENCOUNTER — Telehealth: Payer: Self-pay

## 2021-09-23 ENCOUNTER — Encounter: Payer: Self-pay | Admitting: Allergy

## 2021-09-23 NOTE — Telephone Encounter (Signed)
Per Dr. Maudie Mercury Please place referral to ENT - chronic rhinitis. Dr. Benjamine Mola or [facility].

## 2021-10-03 DIAGNOSIS — F3289 Other specified depressive episodes: Secondary | ICD-10-CM | POA: Diagnosis not present

## 2021-10-30 DIAGNOSIS — F3289 Other specified depressive episodes: Secondary | ICD-10-CM | POA: Diagnosis not present

## 2021-11-23 NOTE — Progress Notes (Signed)
Follow Up Note  RE: Cheryl Hutchinson MRN: [id number] DOB: [birth year] Date of Office Visit: 11/24/2021  Referring provider: Caren Macadam, MD Primary care provider: Caren Macadam, MD  Chief Complaint: Allergic Rhinitis   History of Present Illness: I had the pleasure of seeing Cheryl Hutchinson for a follow up visit at the [facility] on 11/24/2021. She is a 51 y.o. female, who is being followed for allergic rhinoconjunctivitis, pruritus, dermatographism, atopic dermatitis and penicillin allergy. Her previous allergy office visit was on 08/25/2021 with Dr. Maudie Mercury. Today is a regular follow up visit.  Environmental allergies Tried allegra and Singulair with no benefit. Zyrtec seems to work the best for her. Patient is scheduled to see ENT in March. Patient was unable to go sooner as her husband is having some health issues.   Not sure if nasal sprays helped.  Currently her sinuses are doing well but about 1-2 times per month has a sinus pressure episodes.    Pruritus/dermatographism Stable with zyrtec 10mg  daily.  Other atopic dermatitis Some eczema outbreaks on the hands and uses hydrocortisone cream prn with good benefit.    Assessment and Plan: Cheryl Hutchinson is a 51 y.o. female with: Seasonal and perennial allergic rhinoconjunctivitis Past history - Perennial rhinoconjunctivitis symptoms for many years but worsening since moved to [place].  Using Zyrtec daily also for itching with good benefit.  Did not like taking Flonase.  Skin testing over 10 years ago was negative per patient report.  Was on allergy immunotherapy as a child. 2022 skin prick testing showed: Positive to dust mites and one mold. 2022 bloodwork was borderline positive to tree pollen and dust mites. Interim history - Allegra and Singulair not effective. Takes zyrtec only with good benefit.  Continue environmental control measures as below. Use over the counter antihistamines such as Zyrtec  (cetirizine), Claritin (loratadine), Allegra (fexofenadine), or Xyzal (levocetirizine) daily as needed. May take twice a day during allergy flares. May switch antihistamines every few months. Make sure to go see ENT - has appointment in March. Use Nasonex (mometasone) nasal spray 2 sprays per nostril once a day as needed for nasal congestion.  Nasal saline spray (i.e., Simply Saline) or nasal saline lavage (i.e., NeilMed) is recommended as needed and prior to medicated nasal sprays.  Pruritus Controlled.  Continue zyrtec 10mg  daily as before.  Other atopic dermatitis Flares on the hands at times. Continue proper skincare. Use hydrocortisone 2.5% cream twice a day as needed for mild rash flares - okay to use on the face, neck, groin area. Do not use more than 1 week at a time.  Penicillin allergy Past history - Rash over 25 years ago. Consider penicillin allergy skin testing and in office drug challenge in the future.  Over 90% of people with history of penicillin allergy which occurred over 10 years ago are found to be non-allergic.   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  Return in about 4 months (around 03/24/2022).  Meds ordered this encounter  Medications   mometasone (NASONEX) 50 MCG/ACT nasal spray    Sig: Place 2 sprays into the nose daily.    Dispense:  1 each    Refill:  5   Lab Orders  No laboratory test(s) ordered today    Diagnostics: None.   Medication List:  Current Outpatient Medications  Medication Sig Dispense Refill   cetirizine (ZYRTEC) 10 MG chewable tablet Chew 10 mg by mouth daily.  ibuprofen (ADVIL) 200 MG tablet Take 200 mg by mouth every 6 (six) hours as needed.     JUNEL 1/20 1-20 MG-MCG tablet      L-LYSINE PO Take by mouth. As needed for fever blisters     mometasone (NASONEX) 50 MCG/ACT nasal spray Place 2 sprays into the nose daily. 1 each 5   Probiotic Product (PROBIOTIC PO) Take by mouth. Gummies     No current  facility-administered medications for this visit.   Allergies: Allergies  Allergen Reactions   Amoxicillin Rash   I reviewed her past medical history, social history, family history, and environmental history and no significant changes have been reported from her previous visit.  Review of Systems  Constitutional:  Negative for appetite change, chills, fever and unexpected weight change.  HENT:  Positive for congestion. Negative for rhinorrhea.   Eyes:  Negative for itching.  Respiratory:  Negative for cough, chest tightness, shortness of breath and wheezing.   Cardiovascular:  Negative for chest pain.  Gastrointestinal:  Negative for abdominal pain.  Genitourinary:  Negative for difficulty urinating.  Skin:  Negative for rash.  Allergic/Immunologic: Positive for environmental allergies. Negative for food allergies.  Neurological:  Positive for headaches.   Objective: BP 118/78    Pulse 84    Temp 97.9 F (36.6 C) (Temporal)    Resp 17    SpO2 97%  There is no height or weight on file to calculate BMI. Physical Exam Vitals and nursing note reviewed.  Constitutional:      Appearance: Normal appearance. She is well-developed.  HENT:     Head: Normocephalic and atraumatic.     Right Ear: Tympanic membrane and external ear normal.     Left Ear: Tympanic membrane and external ear normal.     Nose: Congestion present.     Mouth/Throat:     Mouth: Mucous membranes are moist.     Pharynx: Oropharynx is clear.  Eyes:     Conjunctiva/sclera: Conjunctivae normal.  Cardiovascular:     Rate and Rhythm: Normal rate and regular rhythm.     Heart sounds: Normal heart sounds. No murmur heard.   No friction rub. No gallop.  Pulmonary:     Effort: Pulmonary effort is normal.     Breath sounds: Normal breath sounds. No wheezing, rhonchi or rales.  Musculoskeletal:     Cervical back: Neck supple.  Skin:    General: Skin is warm.     Findings: No rash.  Neurological:     Mental Status:  She is alert and oriented to person, place, and time.  Psychiatric:        Behavior: Behavior normal.  Previous notes and tests were reviewed. The plan was reviewed with the patient/family, and all questions/concerned were addressed.  It was my pleasure to see Cheryl Hutchinson today and participate in her care. Please feel free to contact me with any questions or concerns.  Sincerely,  Rexene Alberts, DO Allergy & Immunology  [facility] office: [place] office: [phone]

## 2021-11-24 ENCOUNTER — Ambulatory Visit (INDEPENDENT_AMBULATORY_CARE_PROVIDER_SITE_OTHER): Payer: BC Managed Care – PPO | Admitting: Allergy

## 2021-11-24 ENCOUNTER — Encounter: Payer: Self-pay | Admitting: Allergy

## 2021-11-24 ENCOUNTER — Other Ambulatory Visit: Payer: Self-pay

## 2021-11-24 VITALS — BP 118/78 | HR 84 | Temp 97.9°F | Resp 17

## 2021-11-24 DIAGNOSIS — Z880 Allergy status to penicillin: Secondary | ICD-10-CM | POA: Diagnosis not present

## 2021-11-24 DIAGNOSIS — L503 Dermatographic urticaria: Secondary | ICD-10-CM

## 2021-11-24 DIAGNOSIS — L299 Pruritus, unspecified: Secondary | ICD-10-CM

## 2021-11-24 DIAGNOSIS — L2089 Other atopic dermatitis: Secondary | ICD-10-CM

## 2021-11-24 DIAGNOSIS — J302 Other seasonal allergic rhinitis: Secondary | ICD-10-CM

## 2021-11-24 DIAGNOSIS — H1013 Acute atopic conjunctivitis, bilateral: Secondary | ICD-10-CM

## 2021-11-24 DIAGNOSIS — H1010 Acute atopic conjunctivitis, unspecified eye: Secondary | ICD-10-CM | POA: Insufficient documentation

## 2021-11-24 MED ORDER — MOMETASONE FUROATE 50 MCG/ACT NA SUSP: 2.0000 | Freq: Every day | NASAL | 5 refills | Status: AC

## 2021-11-24 NOTE — Assessment & Plan Note (Signed)
Past history - Perennial rhinoconjunctivitis symptoms for many years but worsening since moved to [place].  Using Zyrtec daily also for itching with good benefit.  Did not like taking Flonase.  Skin testing over 10 years ago was negative per patient report.  Was on allergy immunotherapy as a child. 2022 skin prick testing showed: Positive to dust mites and one mold. 2022 bloodwork was borderline positive to tree pollen and dust mites. Interim history - Allegra and Singulair not effective. Takes zyrtec only with good benefit.   Continue environmental control measures as below.  Use over the counter antihistamines such as Zyrtec (cetirizine), Claritin (loratadine), Allegra (fexofenadine), or Xyzal (levocetirizine) daily as needed. May take twice a day during allergy flares. May switch antihistamines every few months.  Make sure to go see ENT - has appointment in March.  Use Nasonex (mometasone) nasal spray 2 sprays per nostril once a day as needed for nasal congestion.   Nasal saline spray (i.e., Simply Saline) or nasal saline lavage (i.e., NeilMed) is recommended as needed and prior to medicated nasal sprays.

## 2021-11-24 NOTE — Assessment & Plan Note (Signed)
Past history - Rash over 25 years ago.  Consider penicillin allergy skin testing and in office drug challenge in the future.   Over 90% of people with history of penicillin allergy which occurred over 10 years ago are found to be non-allergic.

## 2021-11-24 NOTE — Assessment & Plan Note (Signed)
Flares on the hands at times.  Continue proper skincare.  Use hydrocortisone 2.5% cream twice a day as needed for mild rash flares - okay to use on the face, neck, groin area. Do not use more than 1 week at a time.

## 2021-11-24 NOTE — Assessment & Plan Note (Signed)
Past history - +2 dermatographism on exam.

## 2021-11-24 NOTE — Patient Instructions (Addendum)
Environmental allergies 2022 skin testing showed: Positive to dust mites and one mold.  2022 bloodwork was borderline positive to tree pollen and dust mites. Continue environmental control measures as below. Use over the counter antihistamines such as Zyrtec (cetirizine), Claritin (loratadine), Allegra (fexofenadine), or Xyzal (levocetirizine) daily as needed. May take twice a day during allergy flares. May switch antihistamines every few months. Make sure to go see ENT.  Use Nasonex (mometasone) nasal spray 2 sprays per nostril once a day as needed for nasal congestion.  Nasal saline spray (i.e., Simply Saline) or nasal saline lavage (i.e., NeilMed) is recommended as needed and prior to medicated nasal sprays.  Itching: Continue zyrtec 10mg  daily as before.  Atopic dermatitis: See below for proper skincare. Use hydrocortisone 2.5% cream twice a day as needed for mild rash flares - okay to use on the face, neck, groin area. Do not use more than 1 week at a time.  Penicillin allergy: Consider penicillin allergy skin testing and in office drug challenge in the future.   Follow up in 4 months or sooner if needed.    Control of House Dust Mite Allergen Dust mite allergens are a common trigger of allergy and asthma symptoms. While they can be found throughout the house, these microscopic creatures thrive in warm, humid environments such as bedding, upholstered furniture and carpeting. Because so much time is spent in the bedroom, it is essential to reduce mite levels there.  Encase pillows, mattresses, and box springs in special allergen-proof fabric covers or airtight, zippered plastic covers.  Bedding should be washed weekly in hot water (130 F) and dried in a hot dryer. Allergen-proof covers are available for comforters and pillows that cant be regularly washed.  Wash the allergy-proof covers every few months. Minimize clutter in the bedroom. Keep pets out of the bedroom.  Keep humidity  less than 50% by using a dehumidifier or air conditioning. You can buy a humidity measuring device called a hygrometer to monitor this.  If possible, replace carpets with hardwood, linoleum, or washable area rugs. If that's not possible, vacuum frequently with a vacuum that has a HEPA filter. Remove all upholstered furniture and non-washable window drapes from the bedroom. Remove all non-washable stuffed toys from the bedroom.  Wash stuffed toys weekly. Reducing Pollen Exposure Pollen seasons: trees (spring), grass (summer) and ragweed/weeds (fall). Keep windows closed in your home and car to lower pollen exposure.  Install air conditioning in the bedroom and throughout the house if possible.  Avoid going out in dry windy days - especially early morning. Pollen counts are highest between 5 - 10 AM and on dry, hot and windy days.  Save outside activities for late afternoon or after a heavy rain, when pollen levels are lower.  Avoid mowing of grass if you have grass pollen allergy. Be aware that pollen can also be transported indoors on people and pets.  Dry your clothes in an automatic dryer rather than hanging them outside where they might collect pollen.  Rinse hair and eyes before bedtime. Mold Control Mold and fungi can grow on a variety of surfaces provided certain temperature and moisture conditions exist.  Outdoor molds grow on plants, decaying vegetation and soil. The major outdoor mold, Alternaria and Cladosporium, are found in very high numbers during hot and dry conditions. Generally, a late summer - fall peak is seen for common outdoor fungal spores. Rain will temporarily lower outdoor mold spore count, but counts rise rapidly when the rainy period ends.  The most important indoor molds are Aspergillus and Penicillium. Dark, humid and poorly ventilated basements are ideal sites for mold growth. The next most common sites of mold growth are the bathroom and the kitchen. Outdoor (Seasonal)  Mold Control Use air conditioning and keep windows closed. Avoid exposure to decaying vegetation. Avoid leaf raking. Avoid grain handling. Consider wearing a face mask if working in moldy areas.  Indoor (Perennial) Mold Control  Maintain humidity below 50%. Get rid of mold growth on hard surfaces with water, detergent and, if necessary, 5% bleach (do not mix with other cleaners). Then dry the area completely. If mold covers an area more than 10 square feet, consider hiring an indoor environmental professional. For clothing, washing with soap and water is best. If moldy items cannot be cleaned and dried, throw them away. Remove sources e.g. contaminated carpets. Repair and seal leaking roofs or pipes. Using dehumidifiers in damp basements may be helpful, but empty the water and clean units regularly to prevent mildew from forming. All rooms, especially basements, bathrooms and kitchens, require ventilation and cleaning to deter mold and mildew growth. Avoid carpeting on concrete or damp floors, and storing items in damp areas.

## 2021-11-24 NOTE — Assessment & Plan Note (Signed)
Controlled.   Continue zyrtec 10mg  daily as before.

## 2021-12-05 DIAGNOSIS — F3289 Other specified depressive episodes: Secondary | ICD-10-CM | POA: Diagnosis not present

## 2021-12-19 DIAGNOSIS — F3289 Other specified depressive episodes: Secondary | ICD-10-CM | POA: Diagnosis not present

## 2022-01-06 DIAGNOSIS — Z1211 Encounter for screening for malignant neoplasm of colon: Secondary | ICD-10-CM | POA: Diagnosis not present

## 2022-01-06 DIAGNOSIS — Z01419 Encounter for gynecological examination (general) (routine) without abnormal findings: Secondary | ICD-10-CM | POA: Diagnosis not present

## 2022-01-06 DIAGNOSIS — Z1231 Encounter for screening mammogram for malignant neoplasm of breast: Secondary | ICD-10-CM | POA: Diagnosis not present

## 2022-01-06 DIAGNOSIS — Z3040 Encounter for surveillance of contraceptives, unspecified: Secondary | ICD-10-CM | POA: Diagnosis not present

## 2022-01-12 DIAGNOSIS — F3289 Other specified depressive episodes: Secondary | ICD-10-CM | POA: Diagnosis not present

## 2022-01-23 DIAGNOSIS — F3289 Other specified depressive episodes: Secondary | ICD-10-CM | POA: Diagnosis not present

## 2022-02-05 DIAGNOSIS — J310 Chronic rhinitis: Secondary | ICD-10-CM | POA: Diagnosis not present

## 2022-02-05 DIAGNOSIS — J342 Deviated nasal septum: Secondary | ICD-10-CM | POA: Diagnosis not present

## 2022-02-05 DIAGNOSIS — J343 Hypertrophy of nasal turbinates: Secondary | ICD-10-CM | POA: Diagnosis not present

## 2022-02-10 ENCOUNTER — Other Ambulatory Visit: Payer: Self-pay | Admitting: Otolaryngology

## 2022-02-10 DIAGNOSIS — F3289 Other specified depressive episodes: Secondary | ICD-10-CM | POA: Diagnosis not present

## 2022-02-10 DIAGNOSIS — J329 Chronic sinusitis, unspecified: Secondary | ICD-10-CM

## 2022-02-16 ENCOUNTER — Encounter: Payer: Self-pay | Admitting: Allergy

## 2022-02-16 NOTE — Progress Notes (Signed)
Reviewed notes from Dr. Benjamine Mola. Date of service: 02/05/2022. ?See scanned notes for full documentation. ?Mild nasal septal deviation and bilateral inferior turbinate hypertrophy.  No polyps. ?Getting CT sinus. If negative refer to neuro. ? ?

## 2022-03-11 ENCOUNTER — Ambulatory Visit
Admission: RE | Admit: 2022-03-11 | Discharge: 2022-03-11 | Disposition: A | Discharge status: Home / Self Care | Payer: BC Managed Care – PPO | Source: Ambulatory Visit | Attending: Otolaryngology | Admitting: Otolaryngology

## 2022-03-11 DIAGNOSIS — J329 Chronic sinusitis, unspecified: Secondary | ICD-10-CM | POA: Diagnosis not present

## 2022-03-23 DIAGNOSIS — J342 Deviated nasal septum: Secondary | ICD-10-CM | POA: Diagnosis not present

## 2022-03-23 DIAGNOSIS — J310 Chronic rhinitis: Secondary | ICD-10-CM | POA: Diagnosis not present

## 2022-03-23 DIAGNOSIS — J343 Hypertrophy of nasal turbinates: Secondary | ICD-10-CM | POA: Diagnosis not present

## 2022-03-29 NOTE — Progress Notes (Signed)
? ?Follow Up Note ? ?RE: Cheryl Hutchinson MRN: [id number] DOB: [birth year] ?Date of Office Visit: 03/30/2022 ? ?Referring provider: Caren Macadam, MD ?Primary care provider: Caren Macadam, MD ? ?Chief Complaint: Follow-up (From seeing the ENT) ? ?History of Present Illness: ?I had the pleasure of seeing Cheryl Hutchinson for a follow up visit at the [facility] [place] on 03/30/2022. She is a 51 y.o. female, who is being followed for allergic rhinoconjunctivitis, pruritus, atopic dermatitis, penicillin allergy and dermatographism. Her previous allergy office visit was on 08/05/2022 with Dr. Maudie Mercury. Today is a regular follow up visit. ? ?Seasonal and perennial allergic rhinoconjunctivitis ?Saw ENT and had rhinoscopy and CT sinus. No surgical intervention needed. ? ?Currently taking zyrtec '10mg'$  daily, Nasonex 2 sprays per nostril once a day as needed. No nosebleeds. ?  ?Pruritus ?Controlled with zyrtec '10mg'$  daily.  ?  ?Other atopic dermatitis ?Flares on the hands at times and uses hydrocortisone prn with good benefit. ?  ?03/11/2022 CT sinus: ?"IMPRESSION: ?Normally aerated paranasal sinuses. Patent sinus drainage pathways. ?No evidence of acute or chronic sinusitis." ? ?Assessment and Plan: ?Cheryl Hutchinson is a 51 y.o. female with: ?Seasonal and perennial allergic rhinoconjunctivitis ?Past history - Perennial rhinoconjunctivitis symptoms for many years but worsening since moved to [place].  Using Zyrtec daily also for itching with good benefit.  Did not like taking Flonase.  Skin testing over 10 years ago was negative per patient report.  Was on allergy immunotherapy as a child. 2022 skin prick testing showed: Positive to dust mites and one mold. 2022 bloodwork was borderline positive to tree pollen and dust mites. ?Interim history - saw ENT (rhinoscopy and CT sinus done). No surgical intervention. Nasal congestion improved since less tree pollen outdoors.  ?Continue environmental control measures.  ?Use over the  counter antihistamines such as Zyrtec (cetirizine), Claritin (loratadine), Allegra (fexofenadine), or Xyzal (levocetirizine) daily as needed. May take twice a day during allergy flares. May switch antihistamines every few months. ?Use Nasonex (mometasone) nasal spray 2 sprays per nostril once a day as needed for nasal congestion.  ?Nasal saline spray (i.e., Simply Saline) or nasal saline lavage (i.e., NeilMed) is recommended as needed and prior to medicated nasal sprays. ?If symptoms flare again next spring then will recommend AIT next.  ? ?Pruritus ?Controlled.  ?Continue zyrtec '10mg'$  daily as before. ? ?Other atopic dermatitis ?Waxes and wanes on hands.  ?Continue proper skincare. ?Use hydrocortisone 2.5% cream twice a day as needed for mild rash flares - okay to use on the face, neck, groin area. Do not use more than 1 week at a time. ? ?Generalized headache ?Monitor symptoms and if still persistent recommend neurology evaluation next.  ? ?Penicillin allergy ?Past history - Rash over 25 years ago. ?Consider penicillin allergy skin testing and in office drug challenge in the future.  ?Over 90% of people with history of penicillin allergy which occurred over 10 years ago are found to be non-allergic.  ? ?Return in about 4 months (around 07/31/2022). ? ?No orders of the defined types were placed in this encounter. ? ?Lab Orders  ?No laboratory test(s) ordered today  ? ?Diagnostics: ?None. ? ?Medication List:  ?Current Outpatient Medications  ?Medication Sig Dispense Refill  ? cetirizine (ZYRTEC) 10 MG chewable tablet Chew 10 mg by mouth daily.    ? ibuprofen (ADVIL) 200 MG tablet Take 200 mg by mouth every 6 (six) hours as needed.    ? L-LYSINE PO Take by mouth. As needed  for fever blisters    ? mometasone (NASONEX) 50 MCG/ACT nasal spray Place 2 sprays into the nose daily. 1 each 5  ? Probiotic Product (PROBIOTIC PO) Take by mouth. Gummies    ? ?No current facility-administered medications for this visit.   ? ?Allergies: ?Allergies  ?Allergen Reactions  ? Amoxicillin Rash  ?  Other reaction(s): rash  ? ?I reviewed her past medical history, social history, family history, and environmental history and no significant changes have been reported from her previous visit. ? ?Review of Systems  ?Constitutional:  Negative for appetite change, chills, fever and unexpected weight change.  ?HENT:  Positive for congestion. Negative for rhinorrhea.   ?     Improved  ?Eyes:  Negative for itching.  ?Respiratory:  Negative for cough, chest tightness, shortness of breath and wheezing.   ?Cardiovascular:  Negative for chest pain.  ?Gastrointestinal:  Negative for abdominal pain.  ?Genitourinary:  Negative for difficulty urinating.  ?Skin:  Negative for rash.  ?Allergic/Immunologic: Positive for environmental allergies. Negative for food allergies.  ?Neurological:  Positive for headaches.  ? ?Objective: ?BP 112/70   Pulse 80   Temp 98.3 ?F (36.8 ?C)   Resp 16   Ht 5' (1.524 m)   Wt 175 lb 2 oz (79.4 kg)   SpO2 96%   BMI 34.20 kg/m?  ?Body mass index is 34.2 kg/m?Marland Kitchen ?Physical Exam ?Vitals and nursing note reviewed.  ?Constitutional:   ?   Appearance: Normal appearance. She is well-developed.  ?HENT:  ?   Head: Normocephalic and atraumatic.  ?   Right Ear: Tympanic membrane and external ear normal.  ?   Left Ear: Tympanic membrane and external ear normal.  ?   Nose: Congestion present.  ?   Mouth/Throat:  ?   Mouth: Mucous membranes are moist.  ?   Pharynx: Oropharynx is clear.  ?Eyes:  ?   Conjunctiva/sclera: Conjunctivae normal.  ?Cardiovascular:  ?   Rate and Rhythm: Normal rate and regular rhythm.  ?   Heart sounds: Normal heart sounds. No murmur heard. ?  No friction rub. No gallop.  ?Pulmonary:  ?   Effort: Pulmonary effort is normal.  ?   Breath sounds: Normal breath sounds. No wheezing, rhonchi or rales.  ?Musculoskeletal:  ?   Cervical back: Neck supple.  ?Skin: ?   General: Skin is warm.  ?   Findings: No rash.   ?Neurological:  ?   Mental Status: She is alert and oriented to person, place, and time.  ?Psychiatric:     ?   Behavior: Behavior normal.  ? ?Previous notes and tests were reviewed. ?The plan was reviewed with the patient/family, and all questions/concerned were addressed. ? ?It was my pleasure to see Cheryl Hutchinson today and participate in her care. Please feel free to contact me with any questions or concerns. ? ?Sincerely, ? ?Rexene Alberts, DO ?Allergy & Immunology ? ?[facility] [place] ?[place] office: [phone] ?[place] office: [phone] ?

## 2022-03-30 ENCOUNTER — Encounter: Payer: Self-pay | Admitting: Allergy

## 2022-03-30 ENCOUNTER — Ambulatory Visit: Payer: BC Managed Care – PPO | Admitting: Allergy

## 2022-03-30 VITALS — BP 112/70 | HR 80 | Temp 98.3°F | Resp 16 | Ht 60.0 in | Wt 175.1 lb

## 2022-03-30 DIAGNOSIS — H1010 Acute atopic conjunctivitis, unspecified eye: Secondary | ICD-10-CM

## 2022-03-30 DIAGNOSIS — L2089 Other atopic dermatitis: Secondary | ICD-10-CM | POA: Diagnosis not present

## 2022-03-30 DIAGNOSIS — J302 Other seasonal allergic rhinitis: Secondary | ICD-10-CM

## 2022-03-30 DIAGNOSIS — L299 Pruritus, unspecified: Secondary | ICD-10-CM

## 2022-03-30 DIAGNOSIS — L503 Dermatographic urticaria: Secondary | ICD-10-CM | POA: Diagnosis not present

## 2022-03-30 DIAGNOSIS — H1013 Acute atopic conjunctivitis, bilateral: Secondary | ICD-10-CM

## 2022-03-30 DIAGNOSIS — R519 Headache, unspecified: Secondary | ICD-10-CM | POA: Insufficient documentation

## 2022-03-30 DIAGNOSIS — Z880 Allergy status to penicillin: Secondary | ICD-10-CM

## 2022-03-30 NOTE — Assessment & Plan Note (Signed)
?   Monitor symptoms and if still persistent recommend neurology evaluation next.  ?

## 2022-03-30 NOTE — Assessment & Plan Note (Signed)
Past history - Rash over 25 years ago. ?? Consider penicillin allergy skin testing and in office drug challenge in the future.  ?? Over 90% of people with history of penicillin allergy which occurred over 10 years ago are found to be non-allergic.  ?

## 2022-03-30 NOTE — Patient Instructions (Addendum)
Environmental allergies ?2022 skin testing showed: Positive to dust mites and one mold.  ?2022 bloodwork was borderline positive to tree pollen and dust mites. ?Continue environmental control measures as below. ?Use over the counter antihistamines such as Zyrtec (cetirizine), Claritin (loratadine), Allegra (fexofenadine), or Xyzal (levocetirizine) daily as needed. May take twice a day during allergy flares. May switch antihistamines every few months. ?Use Nasonex (mometasone) nasal spray 2 sprays per nostril once a day as needed for nasal congestion.  ?Nasal saline spray (i.e., Simply Saline) or nasal saline lavage (i.e., NeilMed) is recommended as needed and prior to medicated nasal sprays. ? ?Itching: ?Continue zyrtec '10mg'$  daily as before. ? ?Atopic dermatitis: ?See below for proper skincare. ?Use hydrocortisone 2.5% cream twice a day as needed for mild rash flares - okay to use on the face, neck, groin area. Do not use more than 1 week at a time. ? ?Penicillin allergy: ?Consider penicillin allergy skin testing and in office drug challenge in the future.  ? ?Headaches ?Monitor symptoms and if still persistent recommend neurology evaluation next.  ? ?Follow up in 4 months or sooner if needed.   ? ?Control of House Dust Mite Allergen ?Dust mite allergens are a common trigger of allergy and asthma symptoms. While they can be found throughout the house, these microscopic creatures thrive in warm, humid environments such as bedding, upholstered furniture and carpeting. ?Because so much time is spent in the bedroom, it is essential to reduce mite levels there.  ?Encase pillows, mattresses, and box springs in special allergen-proof fabric covers or airtight, zippered plastic covers.  ?Bedding should be washed weekly in hot water (130? F) and dried in a hot dryer. Allergen-proof covers are available for comforters and pillows that can?t be regularly washed.  ?Wash the allergy-proof covers every few months. Minimize  clutter in the bedroom. Keep pets out of the bedroom.  ?Keep humidity less than 50% by using a dehumidifier or air conditioning. You can buy a humidity measuring device called a hygrometer to monitor this.  ?If possible, replace carpets with hardwood, linoleum, or washable area rugs. If that's not possible, vacuum frequently with a vacuum that has a HEPA filter. ?Remove all upholstered furniture and non-washable window drapes from the bedroom. ?Remove all non-washable stuffed toys from the bedroom.  Wash stuffed toys weekly. ?Reducing Pollen Exposure ?Pollen seasons: trees (spring), grass (summer) and ragweed/weeds (fall). ?Keep windows closed in your home and car to lower pollen exposure.  ?Install air conditioning in the bedroom and throughout the house if possible.  ?Avoid going out in dry windy days - especially early morning. ?Pollen counts are highest between 5 - 10 AM and on dry, hot and windy days.  ?Save outside activities for late afternoon or after a heavy rain, when pollen levels are lower.  ?Avoid mowing of grass if you have grass pollen allergy. ?Be aware that pollen can also be transported indoors on people and pets.  ?Dry your clothes in an automatic dryer rather than hanging them outside where they might collect pollen.  ?Rinse hair and eyes before bedtime. ?Mold Control ?Mold and fungi can grow on a variety of surfaces provided certain temperature and moisture conditions exist.  ?Outdoor molds grow on plants, decaying vegetation and soil. The major outdoor mold, Alternaria and Cladosporium, are found in very high numbers during hot and dry conditions. Generally, a late summer - fall peak is seen for common outdoor fungal spores. Rain will temporarily lower outdoor mold spore count, but counts rise  rapidly when the rainy period ends. ?The most important indoor molds are Aspergillus and Penicillium. Dark, humid and poorly ventilated basements are ideal sites for mold growth. The next most common  sites of mold growth are the bathroom and the kitchen. ?Outdoor (Seasonal) Mold Control ?Use air conditioning and keep windows closed. ?Avoid exposure to decaying vegetation. ?Avoid leaf raking. ?Avoid grain handling. ?Consider wearing a face mask if working in moldy areas.  ?Indoor (Perennial) Mold Control  ?Maintain humidity below 50%. ?Get rid of mold growth on hard surfaces with water, detergent and, if necessary, 5% bleach (do not mix with other cleaners). Then dry the area completely. If mold covers an area more than 10 square feet, consider hiring an indoor environmental professional. ?For clothing, washing with soap and water is best. If moldy items cannot be cleaned and dried, throw them away. ?Remove sources e.g. contaminated carpets. ?Repair and seal leaking roofs or pipes. Using dehumidifiers in damp basements may be helpful, but empty the water and clean units regularly to prevent mildew from forming. All rooms, especially basements, bathrooms and kitchens, require ventilation and cleaning to deter mold and mildew growth. Avoid carpeting on concrete or damp floors, and storing items in damp areas. ? ?

## 2022-03-30 NOTE — Assessment & Plan Note (Signed)
Waxes and wanes on hands.  ?? Continue proper skincare. ?? Use hydrocortisone 2.5% cream twice a day as needed for mild rash flares - okay to use on the face, neck, groin area. Do not use more than 1 week at a time. ?

## 2022-03-30 NOTE — Assessment & Plan Note (Addendum)
Past history - Perennial rhinoconjunctivitis symptoms for many years but worsening since moved to [place].  Using Zyrtec daily also for itching with good benefit.  Did not like taking Flonase.  Skin testing over 10 years ago was negative per patient report.  Was on allergy immunotherapy as a child. 2022 skin prick testing showed: Positive to dust mites and one mold. 2022 bloodwork was borderline positive to tree pollen and dust mites. ?Interim history - saw ENT (rhinoscopy and CT sinus done). No surgical intervention. Nasal congestion improved since less tree pollen outdoors.  ?? Continue environmental control measures.  ?? Use over the counter antihistamines such as Zyrtec (cetirizine), Claritin (loratadine), Allegra (fexofenadine), or Xyzal (levocetirizine) daily as needed. May take twice a day during allergy flares. May switch antihistamines every few months. ?? Use Nasonex (mometasone) nasal spray 2 sprays per nostril once a day as needed for nasal congestion.  ?? Nasal saline spray (i.e., Simply Saline) or nasal saline lavage (i.e., NeilMed) is recommended as needed and prior to medicated nasal sprays. ?? If symptoms flare again next spring then will recommend AIT next.  ?

## 2022-03-30 NOTE — Assessment & Plan Note (Signed)
Controlled.  ?? Continue zyrtec '10mg'$  daily as before. ?

## 2022-04-06 DIAGNOSIS — F3289 Other specified depressive episodes: Secondary | ICD-10-CM | POA: Diagnosis not present

## 2022-04-08 ENCOUNTER — Encounter: Payer: Self-pay | Admitting: Allergy

## 2022-04-08 NOTE — Progress Notes (Signed)
Reviewed notes from Dr. Benjamine Mola. Date of service: 03/23/2022. See scanned notes for full documentation. Continue with Flonase and saline irrigation. See neurology for headaches.

## 2022-05-12 DIAGNOSIS — F3289 Other specified depressive episodes: Secondary | ICD-10-CM | POA: Diagnosis not present

## 2022-06-09 DIAGNOSIS — F3289 Other specified depressive episodes: Secondary | ICD-10-CM | POA: Diagnosis not present

## 2022-06-26 DIAGNOSIS — J309 Allergic rhinitis, unspecified: Secondary | ICD-10-CM | POA: Diagnosis not present

## 2022-06-26 DIAGNOSIS — Z1159 Encounter for screening for other viral diseases: Secondary | ICD-10-CM | POA: Diagnosis not present

## 2022-06-26 DIAGNOSIS — M2550 Pain in unspecified joint: Secondary | ICD-10-CM | POA: Diagnosis not present

## 2022-06-26 DIAGNOSIS — E785 Hyperlipidemia, unspecified: Secondary | ICD-10-CM | POA: Diagnosis not present

## 2022-06-26 DIAGNOSIS — Z13220 Encounter for screening for lipoid disorders: Secondary | ICD-10-CM | POA: Diagnosis not present

## 2022-06-26 DIAGNOSIS — E611 Iron deficiency: Secondary | ICD-10-CM | POA: Diagnosis not present

## 2022-06-26 DIAGNOSIS — Z0000 Encounter for general adult medical examination without abnormal findings: Secondary | ICD-10-CM | POA: Diagnosis not present

## 2022-07-14 DIAGNOSIS — F3289 Other specified depressive episodes: Secondary | ICD-10-CM | POA: Diagnosis not present

## 2022-08-03 ENCOUNTER — Ambulatory Visit: Payer: BC Managed Care – PPO | Admitting: Allergy

## 2022-08-18 DIAGNOSIS — F3289 Other specified depressive episodes: Secondary | ICD-10-CM | POA: Diagnosis not present

## 2022-09-15 DIAGNOSIS — F3289 Other specified depressive episodes: Secondary | ICD-10-CM | POA: Diagnosis not present

## 2022-10-13 DIAGNOSIS — F3289 Other specified depressive episodes: Secondary | ICD-10-CM | POA: Diagnosis not present

## 2022-12-21 DIAGNOSIS — F3289 Other specified depressive episodes: Secondary | ICD-10-CM | POA: Diagnosis not present

## 2023-01-08 DIAGNOSIS — Z1231 Encounter for screening mammogram for malignant neoplasm of breast: Secondary | ICD-10-CM | POA: Diagnosis not present

## 2023-01-14 DIAGNOSIS — Z01419 Encounter for gynecological examination (general) (routine) without abnormal findings: Secondary | ICD-10-CM | POA: Diagnosis not present

## 2023-02-23 DIAGNOSIS — F411 Generalized anxiety disorder: Secondary | ICD-10-CM | POA: Diagnosis not present

## 2023-03-25 IMAGING — CT CT MAXILLOFACIAL W/O CM
2 series · 16 of 30 positions shown, 18 images · non-contrast
Comparison: None.

CLINICAL DATA: Chronic sinusitis for 3 years

EXAM:
CT MAXILLOFACIAL WITHOUT CONTRAST
TECHNIQUE: Multidetector CT images of the paranasal sinuses were obtained using
the standard protocol without intravenous contrast.
RADIATION DOSE REDUCTION: This exam was performed according to the
departmental dose-optimization program which includes automated
exposure control, adjustment of the mA and/or kV according to
patient size and/or use of iterative reconstruction technique.

[Series 4: soft tissue · axial · 0.38mm/px · z∈[+15,+116]mm · 8 of 129 slices shown]
[im 14/129  brain]
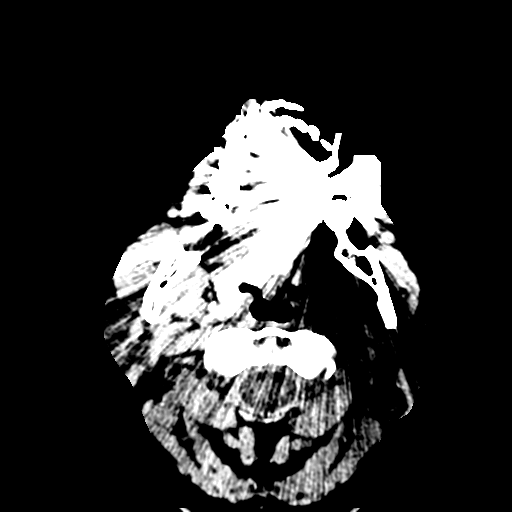
[im 27/129  brain]
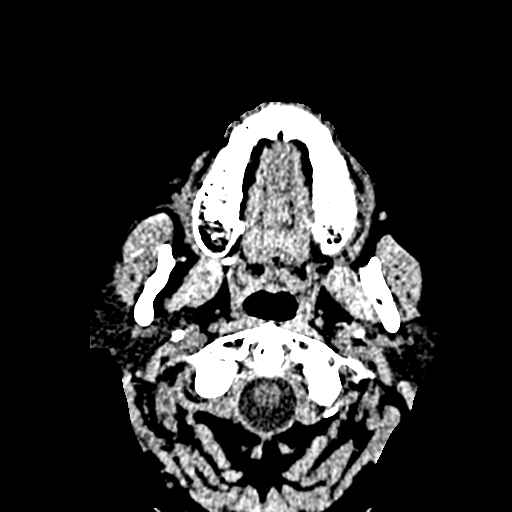
[im 41/129  brain]
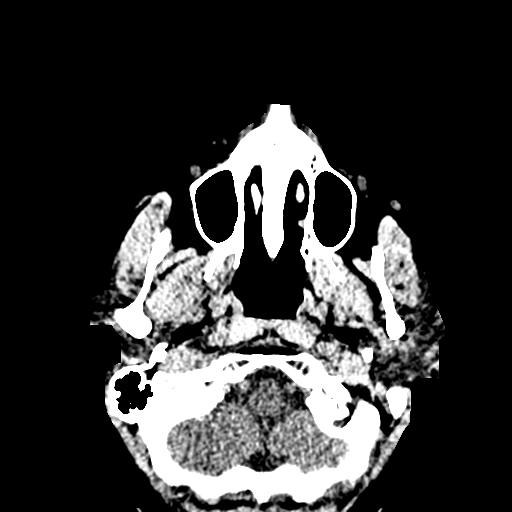
[im 54/129  brain]
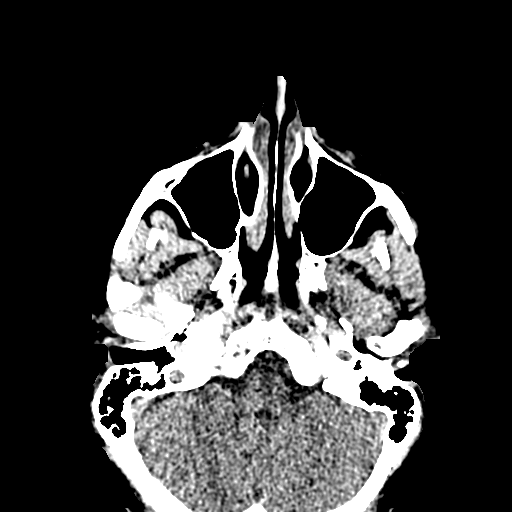
[im 75/129  brain]
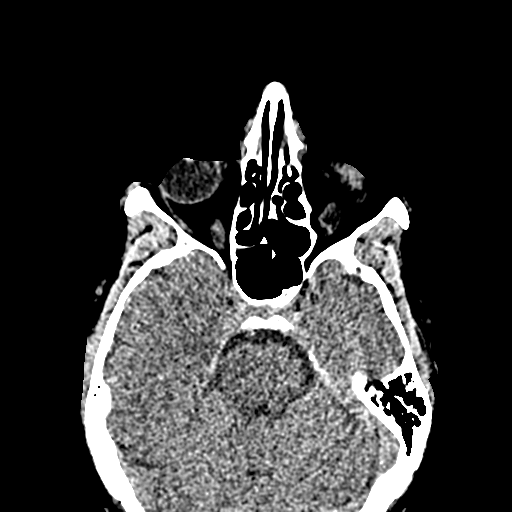
[im 88/129  brain]
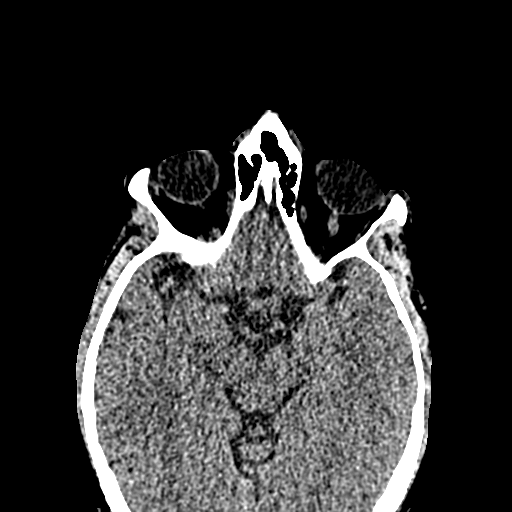
[im 102/129  brain]
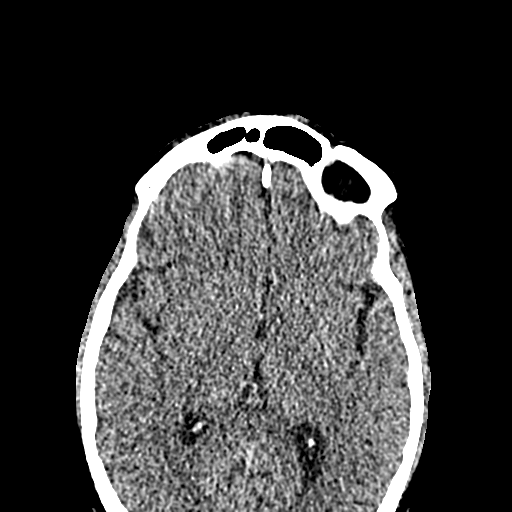
[im 115/129  brain]
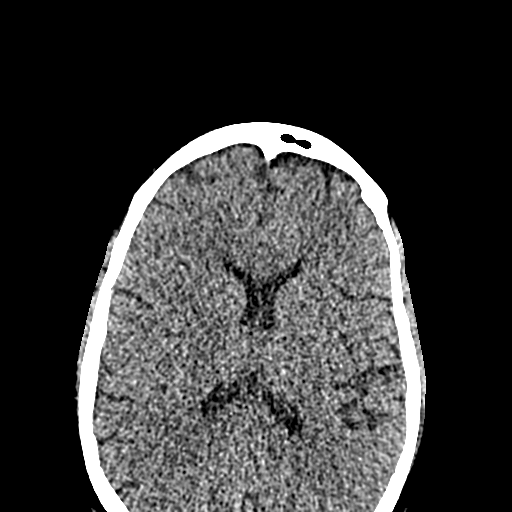

[Series 6: cor soft · axial · 0.35mm/px · z∈[+21,+119]mm · 8 of 65 slices shown, 10 images]
[im 8/65  brain]
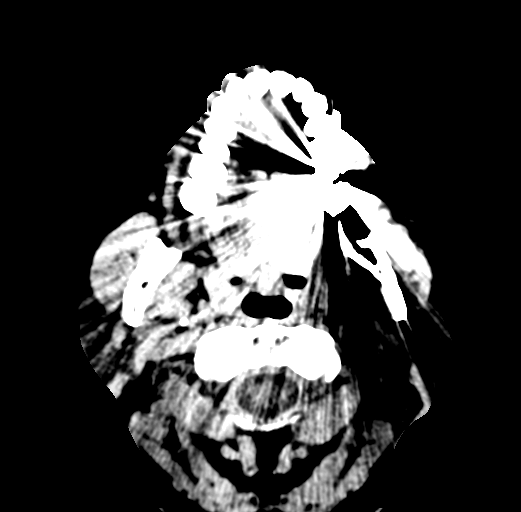
[im 8/65  bone]
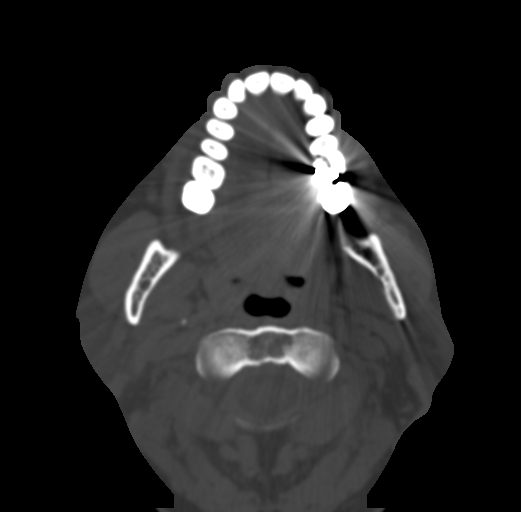
[im 15/65  bone]
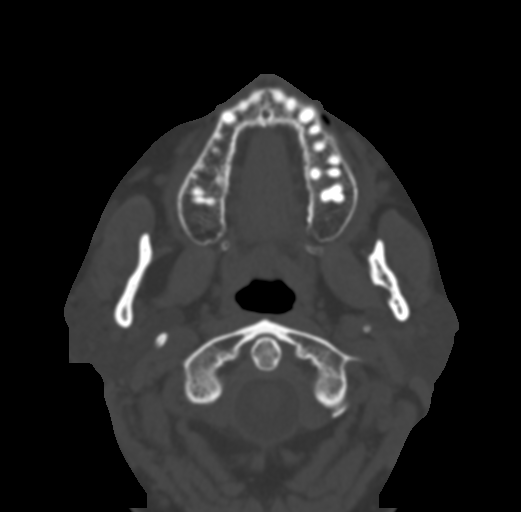
[im 22/65  bone]
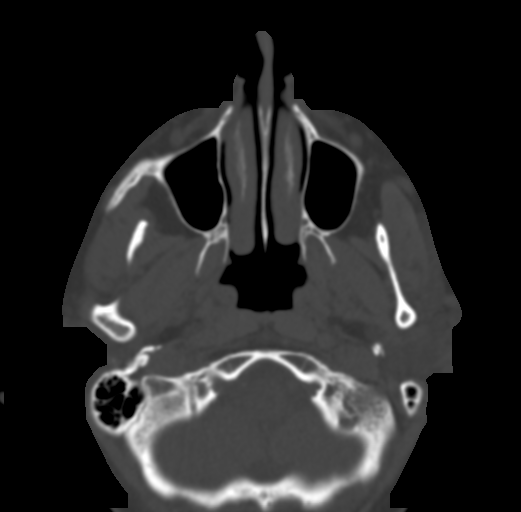
[im 29/65  bone]
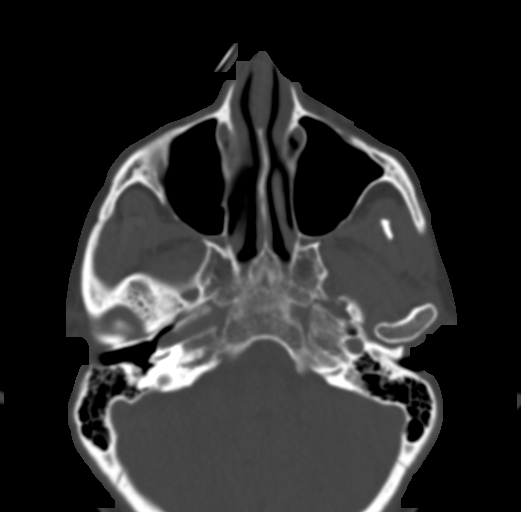
[im 36/65  brain]
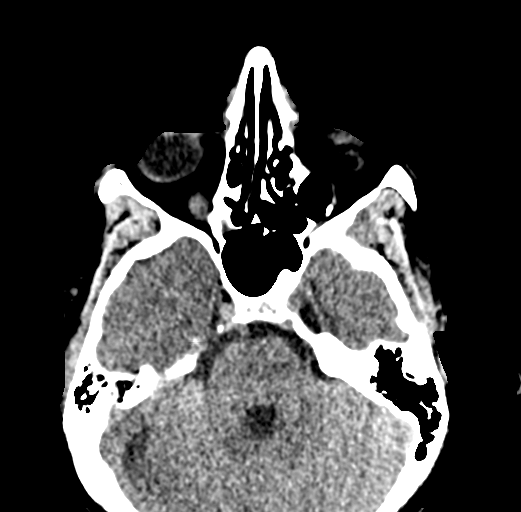
[im 36/65  bone]
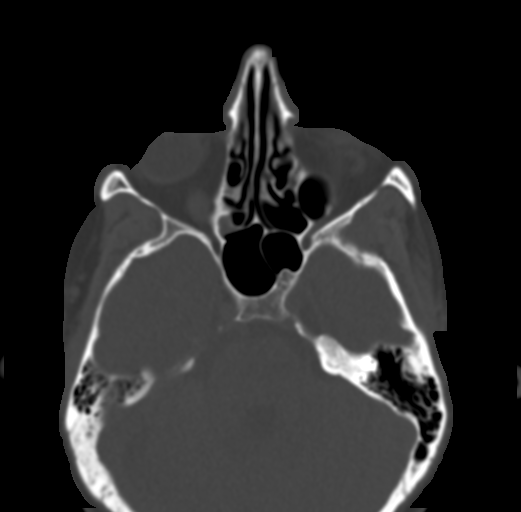
[im 43/65  bone]
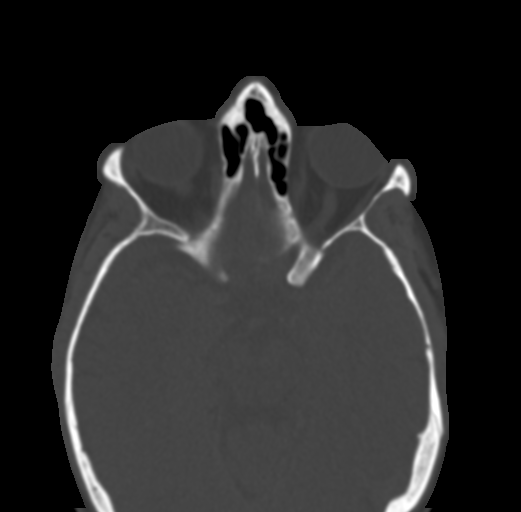
[im 50/65  bone]
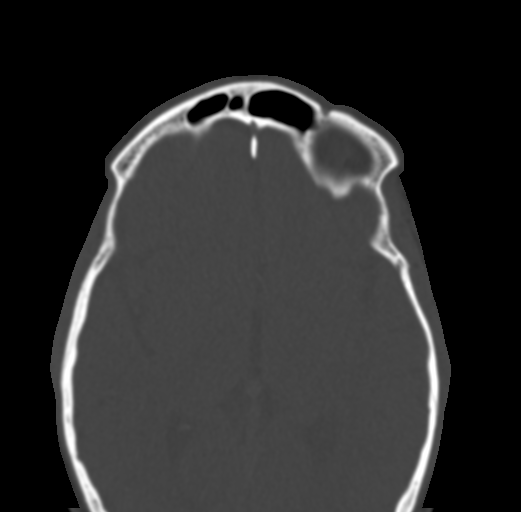
[im 57/65  bone]
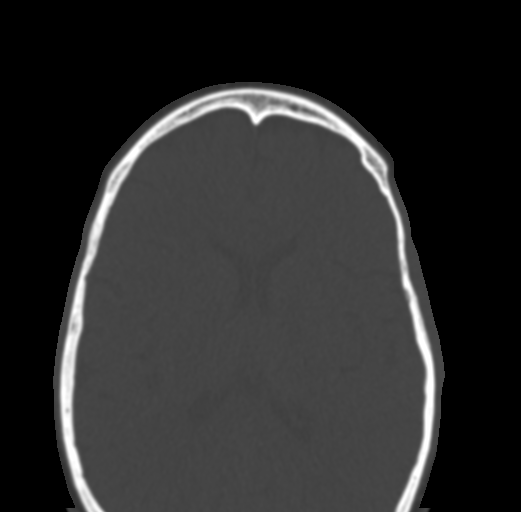

[16 of 30 positions shown; findings below may reference images not displayed]

FINDINGS: Paranasal sinuses:

Frontal: Normally aerated. Patent frontal sinus drainage pathways.

Ethmoid: Normally aerated.

Maxillary: Minimal mucosal thickening inferiorly. Otherwise normally
aerated. No osseous thickening or erosion.

Sphenoid: Minimal mucosal thickening in the anterior right sphenoid
sinus. Otherwise normally aerated. Patent sphenoethmoidal recesses.

Right ostiomeatal unit: Patent.

Left ostiomeatal unit: Patent.

Nasal passages: Patent. Intact nasal septum is minimally deviated to
the left.

Anatomy: No pneumatization superior to anterior ethmoid notches.
Symmetric and intact olfactory grooves and fovea ethmoidalis, Keros
II (4-7mm). Sellar sphenoid pneumatization pattern. No dehiscence of
carotid or optic canals. No onodi cell.

Other: Orbits and intracranial compartment are unremarkable. Visible
mastoid air cells are normally aerated.
IMPRESSION: Normally aerated paranasal sinuses. Patent sinus drainage pathways.
No evidence of acute or chronic sinusitis.

## 2023-06-07 DIAGNOSIS — F411 Generalized anxiety disorder: Secondary | ICD-10-CM | POA: Diagnosis not present

## 2023-07-14 DIAGNOSIS — Z0000 Encounter for general adult medical examination without abnormal findings: Secondary | ICD-10-CM | POA: Diagnosis not present

## 2023-07-14 DIAGNOSIS — Z23 Encounter for immunization: Secondary | ICD-10-CM | POA: Diagnosis not present

## 2023-07-14 DIAGNOSIS — Z13220 Encounter for screening for lipoid disorders: Secondary | ICD-10-CM | POA: Diagnosis not present

## 2023-10-11 DIAGNOSIS — J040 Acute laryngitis: Secondary | ICD-10-CM | POA: Diagnosis not present

## 2023-10-11 DIAGNOSIS — K219 Gastro-esophageal reflux disease without esophagitis: Secondary | ICD-10-CM | POA: Diagnosis not present

## 2023-12-09 ENCOUNTER — Other Ambulatory Visit: Payer: Self-pay | Admitting: Obstetrics and Gynecology

## 2023-12-09 DIAGNOSIS — Z1231 Encounter for screening mammogram for malignant neoplasm of breast: Secondary | ICD-10-CM

## 2024-01-12 ENCOUNTER — Ambulatory Visit
Admission: RE | Admit: 2024-01-12 | Discharge: 2024-01-12 | Disposition: A | Discharge status: Home / Self Care | Payer: BC Managed Care – PPO | Source: Ambulatory Visit | Attending: Obstetrics and Gynecology | Admitting: Obstetrics and Gynecology

## 2024-01-12 DIAGNOSIS — Z1231 Encounter for screening mammogram for malignant neoplasm of breast: Secondary | ICD-10-CM | POA: Diagnosis not present

## 2024-01-20 DIAGNOSIS — Z1339 Encounter for screening examination for other mental health and behavioral disorders: Secondary | ICD-10-CM | POA: Diagnosis not present

## 2024-01-20 DIAGNOSIS — Z01419 Encounter for gynecological examination (general) (routine) without abnormal findings: Secondary | ICD-10-CM | POA: Diagnosis not present

## 2024-01-20 DIAGNOSIS — R6882 Decreased libido: Secondary | ICD-10-CM | POA: Diagnosis not present
